# Patient Record
Sex: Male | Born: 1975
Health system: Southern US, Community
[De-identification: ages and names within clinical notes are randomized; demographics above are authoritative.]

## PROBLEM LIST (undated history)

## (undated) DIAGNOSIS — E119 Type 2 diabetes mellitus without complications: Secondary | ICD-10-CM

## (undated) DIAGNOSIS — K219 Gastro-esophageal reflux disease without esophagitis: Secondary | ICD-10-CM

## (undated) HISTORY — PX: WISDOM TOOTH EXTRACTION: SHX21

## (undated) HISTORY — PX: HERNIA REPAIR: SHX51

---

## 2006-11-28 ENCOUNTER — Emergency Department (HOSPITAL_COMMUNITY): Admission: EM | Admit: 2006-11-28 | Discharge: 2006-11-28 | Payer: Self-pay | Admitting: Emergency Medicine

## 2006-11-29 ENCOUNTER — Ambulatory Visit (HOSPITAL_BASED_OUTPATIENT_CLINIC_OR_DEPARTMENT_OTHER): Admission: RE | Admit: 2006-11-29 | Discharge: 2006-11-29 | Payer: Self-pay | Admitting: General Surgery

## 2006-11-29 ENCOUNTER — Encounter (INDEPENDENT_AMBULATORY_CARE_PROVIDER_SITE_OTHER): Payer: Self-pay | Admitting: General Surgery

## 2010-01-18 ENCOUNTER — Emergency Department (HOSPITAL_COMMUNITY): Admission: EM | Admit: 2010-01-18 | Discharge: 2010-01-18 | Payer: Self-pay | Admitting: Family Medicine

## 2010-11-16 NOTE — Op Note (Signed)
[name], [name]               ACCOUNT NO.:  [id number]   MEDICAL RECORD NO.:  [id number]          PATIENT TYPE:  AMB   LOCATION:  DSC                          FACILITY:  [facility]   PHYSICIAN:  Anselm Pancoast. Weatherly, M.D.DATE OF BIRTH:  01/22/1976   DATE OF PROCEDURE:  11/29/2006  DATE OF DISCHARGE:                               OPERATIVE REPORT   PREOPERATIVE DIAGNOSIS:  Umbilical hernia.   POSTOPERATIVE DIAGNOSIS:  Umbilical hernia.   PROCEDURE PERFORMED:  Umbilical herniorrhaphy.   ANESTHESIA:  General anesthesia.   SURGEON:  Anselm Pancoast. Zachery Dakins, M.D.   HISTORY:  Timothy Sutton is a 35 year old, about 245-pound male, who I  was called to see by Dr. Loleta Chance yesterday after he presented with a day-  and-a-half history of pain at the umbilicus with a small umbilical  hernia that he has had for years.  I saw the patient in the emergency  room at approximately 8:00 p.m. and reduced the hernia.  His pain  subsided immediately afterwards, and I added him to the OR schedule for  this afternoon.  The patient otherwise is in good health.  He weighs 239  pounds and I discussed about the possibility of placing mesh, but that I  would make that decision at the time of surgery.   DESCRIPTION OF PROCEDURE:  The abdomen was clipped after the induction  of general anesthesia with an endotracheal tube.  The abdomen was  prepped with Betadine and surgical scrubbing solution and then draped in  the sterile manner.  He had a little bulge above the umbilicus and I  made a transverse incision, sharp dissection, identifying the hernia sac  that was separated from the overlying skin and then circumferentially we  freed up the hernia sac.  The fascial defect was about the size of maybe  a quarter to maybe a 50-cent piece, a fairly large wad of hernia sac and  preperitoneal fatty tissue, and this was dissected sharply  circumferentially and then I closed the peritoneum to the fascia with a  running 2-0  Vicryl counter circumferentially.   The patient's fascia -- I elected not to put mesh in.  I felt that  transversing closure with #1 Novofil and five sutures were placed.  The  two lateral were placed, and then the three in betweening so that the  fascia was approximated, and then I used some 0 Surgilon to counter  incorporate the fascia between the counter Novofil sutures.  I  anesthetized the fascia with about 20 mL of 0.5% Marcaine with  adrenaline and clipped all the stitches.  Next the subcutaneous tissue  was closed in two layers with 3-0 Vicryl; and then the skin was closed  with interrupted simple sutures of 4-0  nylon.  A McCall's rolled up was placed in the umbilicus to create a  little fascial indentation or a little skin indentation, and then a 4 x  4 was placed over it and held in place with Hypafix.  The patient  tolerated the procedure nicely, awakened nicely, and will be released  after a short stay in the recovery  room.           ______________________________  Anselm Pancoast. Zachery Dakins, M.D.     WJW/MEDQ  D:  11/29/2006  T:  11/29/2006  Job:  [id number]   cc:   Annia Friendly. Loleta Chance, MD  Renaye Rakers, M.D.

## 2012-03-13 ENCOUNTER — Emergency Department (HOSPITAL_COMMUNITY)
Admission: EM | Admit: 2012-03-13 | Discharge: 2012-03-13 | Disposition: A | Discharge status: Home / Self Care | Payer: 59 | Attending: Emergency Medicine | Admitting: Emergency Medicine

## 2012-03-13 ENCOUNTER — Emergency Department (HOSPITAL_COMMUNITY): Payer: 59

## 2012-03-13 ENCOUNTER — Encounter (HOSPITAL_COMMUNITY): Payer: Self-pay | Admitting: Emergency Medicine

## 2012-03-13 DIAGNOSIS — Z806 Family history of leukemia: Secondary | ICD-10-CM | POA: Insufficient documentation

## 2012-03-13 DIAGNOSIS — R109 Unspecified abdominal pain: Secondary | ICD-10-CM | POA: Insufficient documentation

## 2012-03-13 DIAGNOSIS — Z833 Family history of diabetes mellitus: Secondary | ICD-10-CM | POA: Insufficient documentation

## 2012-03-13 DIAGNOSIS — R52 Pain, unspecified: Secondary | ICD-10-CM | POA: Insufficient documentation

## 2012-03-13 DIAGNOSIS — Z87891 Personal history of nicotine dependence: Secondary | ICD-10-CM | POA: Insufficient documentation

## 2012-03-13 LAB — COMPREHENSIVE METABOLIC PANEL
ALT: 30 U/L (ref 0–53)
AST: 28 U/L (ref 0–37)
CO2: 27 mEq/L (ref 19–32)
Calcium: 9.9 mg/dL (ref 8.4–10.5)
Sodium: 135 mEq/L (ref 135–145)
Total Protein: 8.2 g/dL (ref 6.0–8.3)

## 2012-03-13 LAB — CBC
MCH: 28.1 pg (ref 26.0–34.0)
Platelets: 361 10*3/uL (ref 150–400)
RBC: 5.41 MIL/uL (ref 4.22–5.81)

## 2012-03-13 LAB — URINALYSIS, ROUTINE W REFLEX MICROSCOPIC
Bilirubin Urine: NEGATIVE
Glucose, UA: NEGATIVE mg/dL
Hgb urine dipstick: NEGATIVE
Specific Gravity, Urine: 1.025 (ref 1.005–1.030)
pH: 7 (ref 5.0–8.0)

## 2012-03-13 MED ORDER — HYDROCODONE-ACETAMINOPHEN 5-500 MG PO TABS: 1.0000 | ORAL_TABLET | Freq: Four times a day (QID) | ORAL | Status: AC | PRN

## 2012-03-13 MED ORDER — IBUPROFEN 600 MG PO TABS: 600.0000 mg | ORAL_TABLET | Freq: Four times a day (QID) | ORAL | Status: AC | PRN

## 2012-03-13 MED ORDER — CYCLOBENZAPRINE HCL 10 MG PO TABS: 10.0000 mg | ORAL_TABLET | Freq: Two times a day (BID) | ORAL | Status: AC | PRN

## 2012-03-13 NOTE — ED Notes (Signed)
Pt states he started having pain in his right side last Friday but it was relieved by ibuprofen   Pt states he took more ibuprofen this am and felt ok but after a nap this evening when he woke up the pain was so bad he could hardly move  Pt states when he takes a deep breath it feels like something inside is pushing down

## 2012-03-13 NOTE — ED Notes (Signed)
Pt states he has been having right flank pain. Denies having difficulties urinating or noticing blood in urine.  Pt denies being in any pain at this time. Pt aware that he is being discharged and waiting on d/c paperwork. Gave pt ginger ale.

## 2012-03-13 NOTE — ED Notes (Signed)
Pt reports intermittent rt flank pain that began on Friday - pain relieved w/ ibuprofen, tonight pt states pain awoke him from sleep approx 0300 - pt admits to pain increasing w/ movement or lying flat - denies hematuria, n/v, or fever.

## 2012-03-13 NOTE — ED Provider Notes (Signed)
Medical screening examination/treatment/procedure(s) were performed by non-physician practitioner and as supervising physician I was immediately available for consultation/collaboration.  Amarion Portell, MD 03/13/12 1541 

## 2012-03-13 NOTE — ED Provider Notes (Signed)
History     CSN: [id number]  Arrival date & time 03/13/12  7829   First MD Initiated Contact with Patient 03/13/12 0606      Chief Complaint  Patient presents with  . Flank Pain    (Consider location/radiation/quality/duration/timing/severity/associated sxs/prior treatment) Patient is a 36 y.o. male presenting with flank pain. The history is provided by the patient.  Flank Pain This is a new problem. The current episode started in the past 7 days. The problem occurs constantly. The problem has been unchanged. Pertinent negatives include no abdominal pain, chest pain, chills, congestion, coughing, fever, headaches, nausea, neck pain, urinary symptoms, vomiting or weakness. The symptoms are aggravated by twisting and coughing (deep breathing). He has tried NSAIDs for the symptoms. The treatment provided mild relief.  Pt states pain is in the right flank. Started 3 days ago. Worsened by movement and when he takes a deep breath. At first improved with ibuprofen, now no relief. Tried stool softner as well with no improvement. No cough, no shortness of breath, no abdominal pain otherwise, no problem with urination or bowel movements.   History reviewed. No pertinent past medical history.  Past Surgical History  Procedure Date  . Hernia repair     Family History  Problem Relation Age of Onset  . Leukemia Father   . Diabetes Other     History  Substance Use Topics  . Smoking status: Former Smoker    Types: Cigarettes  . Smokeless tobacco: Not on file  . Alcohol Use: Yes     occ      Review of Systems  Constitutional: Negative for fever and chills.  HENT: Negative for congestion, neck pain and neck stiffness.   Respiratory: Negative for cough and chest tightness.   Cardiovascular: Negative for chest pain.  Gastrointestinal: Negative for nausea, vomiting and abdominal pain.  Genitourinary: Positive for flank pain. Negative for dysuria, hematuria and difficulty urinating.    Musculoskeletal: Positive for back pain.  Skin: Negative.   Neurological: Negative for dizziness, weakness and headaches.  Hematological: Negative.     Allergies  Review of patient's allergies indicates no known allergies.  Home Medications   Current Outpatient Rx  Name Route Sig Dispense Refill  . IBUPROFEN 200 MG PO TABS Oral Take 600 mg by mouth every 6 (six) hours as needed.      BP 136/81  Pulse 62  Temp 98.5 F (36.9 C) (Oral)  Resp 18  SpO2 97%  Physical Exam  Nursing note and vitals reviewed. Constitutional: He is oriented to person, place, and time. He appears well-developed and well-nourished. No distress.  Eyes: Conjunctivae are normal.  Neck: Neck supple.  Cardiovascular: Normal rate, regular rhythm and normal heart sounds.   Pulmonary/Chest: Effort normal and breath sounds normal. No respiratory distress. He has no wheezes. He has no rales.  Abdominal: Soft. Bowel sounds are normal. He exhibits no distension. There is no tenderness. There is no rebound.       No CVA tenderness bilaterally  Musculoskeletal: Normal range of motion. He exhibits no edema.  Neurological: He is alert and oriented to person, place, and time.  Skin: Skin is warm and dry.  Psychiatric: He has a normal mood and affect.    ED Course  Procedures (including critical care time)  Pt seen and examined. Right flank pain, no urinary symptoms, no abdominal pain, no n/v/d. Pain worsened with movement. Will get CXR to r/o right lower lung pathology, labs, liver function, UA.  Results for orders placed during the hospital encounter of 03/13/12  CBC      Component Value Range   WBC 7.4  4.0 - 10.5 K/uL   RBC 5.41  4.22 - 5.81 MIL/uL   Hemoglobin 15.2  13.0 - 17.0 g/dL   HCT 16.1  09.6 - 04.5 %   MCV 81.0  78.0 - 100.0 fL   MCH 28.1  26.0 - 34.0 pg   MCHC 34.7  30.0 - 36.0 g/dL   RDW 40.9  81.1 - 91.4 %   Platelets 361  150 - 400 K/uL  COMPREHENSIVE METABOLIC PANEL      Component Value  Range   Sodium 135  135 - 145 mEq/L   Potassium 4.4  3.5 - 5.1 mEq/L   Chloride 99  96 - 112 mEq/L   CO2 27  19 - 32 mEq/L   Glucose, Bld 110 (*) 70 - 99 mg/dL   BUN 11  6 - 23 mg/dL   Creatinine, Ser 7.82  0.50 - 1.35 mg/dL   Calcium 9.9  8.4 - 95.6 mg/dL   Total Protein 8.2  6.0 - 8.3 g/dL   Albumin 4.4  3.5 - 5.2 g/dL   AST 28  0 - 37 U/L   ALT 30  0 - 53 U/L   Alkaline Phosphatase 77  39 - 117 U/L   Total Bilirubin 0.4  0.3 - 1.2 mg/dL   GFR calc non Af Amer >90  >90 mL/min   GFR calc Af Amer >90  >90 mL/min  URINALYSIS, ROUTINE W REFLEX MICROSCOPIC      Component Value Range   Color, Urine YELLOW  YELLOW   APPearance CLOUDY (*) CLEAR   Specific Gravity, Urine 1.025  1.005 - 1.030   pH 7.0  5.0 - 8.0   Glucose, UA NEGATIVE  NEGATIVE mg/dL   Hgb urine dipstick NEGATIVE  NEGATIVE   Bilirubin Urine NEGATIVE  NEGATIVE   Ketones, ur NEGATIVE  NEGATIVE mg/dL   Protein, ur NEGATIVE  NEGATIVE mg/dL   Urobilinogen, UA 1.0  0.0 - 1.0 mg/dL   Nitrite NEGATIVE  NEGATIVE   Leukocytes, UA NEGATIVE  NEGATIVE   Dg Chest 2 View  03/13/2012  *RADIOLOGY REPORT*  Clinical Data: New onset right chest and right flank pain.  CHEST - 2 VIEW  Comparison: None.  Findings: The heart size and pulmonary vascularity are normal. The lungs appear clear and expanded without focal air space disease or consolidation. No blunting of the costophrenic angles.  No pneumothorax.  Mediastinal contours appear intact.  IMPRESSION: No evidence of active pulmonary disease.   Original Report Authenticated By: Marlon Pel, M.D.     7:39 AM Pt reassessed. Pain in right flank, at this time only with movement. He is PERC negative, negative CXR, labs unremarkable with normal LFTs, no WBC. At this time, i do not suspect acute abdomen, no abdominal tenderness on exam. UA unremarkable, no signs of infection or blood, doubt kidney stone or pyelonephritis. Pt has no problems with bowels. VS normal. Suspect pain is  musculoskeletal, however he will need close follow up for recheck and further testing if it persists. Pain meds, muscle relaxant, follow up with PCP. Return if worsening.   1. Acute right flank pain       MDM          Lottie Mussel, PA 03/13/12 1526

## 2014-08-20 ENCOUNTER — Emergency Department (HOSPITAL_COMMUNITY)
Admission: EM | Admit: 2014-08-20 | Discharge: 2014-08-20 | Disposition: A | Discharge status: Home / Self Care | Payer: 59 | Source: Home / Self Care | Attending: Family Medicine | Admitting: Family Medicine

## 2014-08-20 ENCOUNTER — Encounter (HOSPITAL_COMMUNITY): Payer: Self-pay | Admitting: Emergency Medicine

## 2014-08-20 ENCOUNTER — Emergency Department (INDEPENDENT_AMBULATORY_CARE_PROVIDER_SITE_OTHER): Payer: 59

## 2014-08-20 DIAGNOSIS — M79659 Pain in unspecified thigh: Secondary | ICD-10-CM

## 2014-08-20 MED ORDER — DICLOFENAC SODIUM 75 MG PO TBEC: 75.0000 mg | DELAYED_RELEASE_TABLET | Freq: Two times a day (BID) | ORAL | Status: DC | PRN

## 2014-08-20 NOTE — ED Provider Notes (Signed)
TYWAUN HILTNER is a 39 y.o. male who presents to Urgent Care today for left thigh pain present for 3 weeks. Patient notes 4-10 pain in the left posterior upper thigh. The pain does not radiate. No weakness or numbness bowel bladder dysfunction or difficulty walking. No injury. Patient notes that he shoveled snow before the pain started about 3 weeks ago. He has tried some ibuprofen which helps a little. The pain is aching and seems to be worse with activity and prolonged sitting.   History reviewed. No pertinent past medical history. Past Surgical History  Procedure Laterality Date  . Hernia repair     History  Substance Use Topics  . Smoking status: Former Smoker    Types: Cigarettes  . Smokeless tobacco: Not on file  . Alcohol Use: Yes     Comment: occ   ROS as above Medications: No current facility-administered medications for this encounter.   Current Outpatient Prescriptions  Medication Sig Dispense Refill  . diclofenac (VOLTAREN) 75 MG EC tablet Take 1 tablet (75 mg total) by mouth 2 (two) times daily as needed. 30 tablet 0   No Known Allergies   Exam:  BP 132/80 mmHg  Pulse 65  Temp(Src) 98.7 F (37.1 C) (Oral)  Resp 20  SpO2 100% Gen: Well NAD HEENT: EOMI,  MMM Lungs: Normal work of breathing. CTABL Heart: RRR no MRG Abd: NABS, Soft. Nondistended, Nontender Exts: Brisk capillary refill, warm and well perfused.  MSK: Act nontender to spinal midline. Negative straight leg raise test and Faber test bilaterally Pelvis hips nontender bilaterally with normal range of motion. Initial tuberosity is nontender on left. Thigh posterior thigh is mildly tender with some pain with resisted knee flexion No palpable defect present left thigh Normal H test bilaterally   No results found for this or any previous visit (from the past 24 hour(s)). Dg Femur Min 2 Views Left  08/20/2014   CLINICAL DATA:  Left thigh pain for 3 weeks, no known injury  EXAM: LEFT FEMUR 2 VIEWS   COMPARISON:  None.  FINDINGS: Four views of left femur submitted. No acute fracture or subluxation. Visualized left hip joint is unremarkable. No periosteal reaction or bony erosion.  IMPRESSION: Negative.   Electronically Signed   By: Lahoma Crocker M.D.   On: 08/20/2014 10:12    Assessment and Plan: 39 y.o. male with posterior thigh pain. I suspect patient has a hamstring strain. Plan to perform Astelin hamstring exercises and use diclofenac for pain. Follow up with sports medicine if not better.  Discussed warning signs or symptoms. Please see discharge instructions. Patient expresses understanding.     Gregor Hams, MD 08/20/14 1028

## 2014-08-20 NOTE — Discharge Instructions (Signed)
Thank you for coming in today.  I think you have a hamstring injury.  Do the Astelin Hamstring Exercises.   Return as needed.  Follow up with Dr. Tamala Julian as needed.  Hamstring Strain with Rehab The hamstring muscle and tendons are vulnerable to muscle or tendon tear (strain). Hamstring tears cause pain and inflammation in the backside of the thigh, where the hamstring muscles are located. The hamstring is comprised of three muscles that are responsible for straightening the hip, bending the knee, and stabilizing the knee. These muscles are important for walking, running, and jumping. Hamstring strain is the most common injury of the thigh. Hamstring strains are classified as grade 1 or 2 strains. Grade 1 strains cause pain, but the tendon is not lengthened. Grade 2 strains include a lengthened ligament due to the ligament being stretched or partially ruptured. With grade 2 strains there is still function, although the function may be decreased.  SYMPTOMS   Pain, tenderness, swelling, warmth, or redness over the hamstring muscles, at the back of the thigh.  Pain that gets worse during and after intense activity.  A "pop" heard in the area, at the time of injury.  Muscle spasm in the hamstring muscles.  Pain or weakness with running, jumping, or bending the knee against resistance.  Crackling sound (crepitation) when the tendon is moved or touched.  Bruising (contusion) in the thigh within 48 hours of injury.  Loss of fullness of the muscle, or area of muscle bulging in the case of a complete rupture. CAUSES  A muscle strain occurs when a force is placed on the muscle or tendon that is greater than it can withstand. Common causes of injury include:  Strain from overuse or sudden increase in the frequency, duration, or intensity of activity.  Single violent blow or force to the back of the knee or the hamstring area of the thigh. RISK INCREASES WITH:  Sports that require quick starts  (sprinting, racquetball, tennis).  Sports that require jumping (basketball, volleyball).  Kicking sports and water skiing.  Contact sports (soccer, football).  Poor strength and flexibility.  Failure to warm up properly before activity.  Previous thigh, knee, or pelvis injury.  Poor exercise technique.  Poor posture. PREVENTION  Maintain physical fitness:  Strength, flexibility, and endurance.  Cardiovascular fitness.  Learn and use proper exercise technique and posture.  Wear proper fitted and padded protective equipment. PROGNOSIS  If treated properly, hamstring strains are usually curable in 2 to 6 weeks. RELATED COMPLICATIONS   Longer healing time, if not properly treated or if not given adequate time to heal.  Chronically inflamed tendon, causing persistent pain with activity that may progress to constant pain.  Recurring symptoms, if activity is resumed too soon.  Vulnerable to repeated injury (in up to 25% of cases). TREATMENT  Treatment first involves the use of ice and medication to help reduce pain and inflammation. It is also important to complete strengthening and stretching exercises, as well as modifying any activities that aggravate the symptoms. These exercises may be completed at home or with a therapist. Your caregiver may recommend the use of crutches to help reduce pain and discomfort, especially is the strain is severe enough to cause limping. If the tendon has pulled away from the bone, then surgery is necessary to reattach it. MEDICATION   If pain medicine is needed, nonsteroidal anti-inflammatory medicines (aspirin and ibuprofen), or other minor pain relievers (acetaminophen), are often advised.  Do not take pain medicine for  7 days before surgery.  Prescription pain relievers may be given if your caregiver thinks they are needed. Use only as directed and only as much as you need.  Corticosteroid injections may be recommended. However, these  injections should only be used for serious cases, as they can only be given a certain number of times.  Ointments applied to the skin may be beneficial. HEAT AND COLD  Cold treatment (icing) relieves pain and reduces inflammation. Cold treatment should be applied for 10 to 15 minutes every 2 to 3 hours, and immediately after activity that aggravates your symptoms. Use ice packs or an ice massage.  Heat treatment may be used before performing the stretching and strengthening activities prescribed by your caregiver, physical therapist, or athletic trainer. Use a heat pack or a warm water soak. SEEK MEDICAL CARE IF:   Symptoms get worse or do not improve in 2 weeks, despite treatment.  New, unexplained symptoms develop. (Drugs used in treatment may produce side effects.) EXERCISES RANGE OF MOTION (ROM) AND STRETCHING EXERCISES - Hamstring Strain These exercises may help you when beginning to rehabilitate your injury. Your symptoms may go away with or without further involvement from your physician, physical therapist or athletic trainer. While completing these exercises, remember:   Restoring tissue flexibility helps normal motion to return to the joints. This allows healthier, less painful movement and activity.  An effective stretch should be held for at least 30 seconds.  A stretch should never be painful. You should only feel a gentle lengthening or release in the stretched tissue. STRETCH - Hamstrings, Standing  Stand or sit, and extend your right / left leg, placing your foot on a chair or foot stool.  Keep a slight arch in your low back and your hips straight forward.  Lead with your chest, and lean forward at the waist until you feel a gentle stretch in the back of your right / left knee or thigh. (When done correctly, this exercise requires leaning only a small distance.)  Hold this position for __________ seconds. Repeat __________ times. Complete this stretch __________ times  per day. STRETCH - Hamstrings, Supine   Lie on your back. Loop a belt or towel over the ball of your right / left foot.  Straighten your right / left knee and slowly pull on the belt to raise your leg. Do not allow the right / left knee to bend. Keep your opposite leg flat on the floor.  Raise the leg until you feel a gentle stretch behind your right / left knee or thigh. Hold this position for __________ seconds. Repeat __________ times. Complete this stretch __________ times per day.  STRETCH - Hamstrings, Doorway  Lie on your back with your right / left leg extended and resting on the wall, and the opposite leg flat on the ground through the door. Initially, position your bottom farther away from the wall.  Keep your right / left knee straight. If you feel a stretch behind your knee or thigh, hold this position for __________ seconds.  If you do not feel a stretch, scoot your bottom closer to the door and hold __________ seconds. Repeat __________ times. Complete this stretch __________ times per day.  STRETCH - Hamstrings/Adductors, V-Sit   Sit on the floor with your legs extended in a large "V," keeping your knees straight.  With your head and chest upright, bend at your waist reaching for your left foot to stretch your right thigh muscles.  You should feel  a stretch in your right inner thigh. Hold for __________ seconds.  Return to the upright position to relax your leg muscles.  Continuing to keep your chest upright, bend straight forward at your waist to stretch your hamstrings.  You should feel a stretch behind both of your thighs and knees. Hold for __________ seconds.  Return to the upright position to relax your leg muscles.  With your head and chest upright, bend at your waist reaching for your right foot to stretch your left thigh muscles.  You should feel a stretch in your left inner thigh. Hold for __________ seconds.  Return to the upright position to relax your  leg muscles. Repeat __________ times. Complete this exercise __________ times per day.  STRENGTHENING EXERCISES - Hamstring Strain These exercises may help you when beginning to rehabilitate your injury. They may resolve your symptoms with or without further involvement from your physician, physical therapist or athletic trainer. While completing these exercises, remember:   Muscles can gain both the endurance and the strength needed for everyday activities through controlled exercises.  Complete these exercises as instructed by your physician, physical therapist or athletic trainer. Increase the resistance and repetitions only as guided.  You may experience muscle soreness or fatigue, but the pain or discomfort you are trying to eliminate should never get worse during these exercises. If this pain does get worse, stop and make certain you are following the directions exactly. If the pain is still present after adjustments, discontinue the exercise until you can discuss the trouble with your clinician. STRENGTH - Hip Extensors, Straight Leg Raises   Lie on your stomach on a firm surface.  Tense the muscles in your buttocks to lift your right / left leg about 4 inches. If you cannot lift your leg this high without arching your back, place a pillow under your hips.  Keep your knee straight. Hold for __________ seconds.  Slowly lower your leg to the starting position and allow it to relax completely before starting the next repetition. Repeat __________ times. Complete this exercise __________ times per day.  STRENGTH - Hamstring, Isometrics   Lie on your back on a firm surface.  Bend your right / left knee approximately __________ degrees.  Dig your heel into the surface, as if you are trying to pull it toward your buttocks. Tighten the muscles in the back of your thighs to "dig" as hard as you can, without increasing any pain.  Hold this position for __________ seconds.  Release the  tension gradually and allow your muscles to completely relax for __________ seconds between each exercise. Repeat __________ times. Complete this exercise __________ times per day.  STRENGTH - Hamstring, Curls   Lay on your stomach with your legs extended. (If you lay on a bed, your feet may hang over the edge.)  Tighten the muscles in the back of your thigh to bend your right / left knee up to 90 degrees. Keep your hips flat on the bed or floor.  Hold this position for __________ seconds.  Slowly lower your leg back to the starting position. Repeat __________ times. Complete this exercise __________ times per day.  OPTIONAL ANKLE WEIGHTS: Begin with ____________________, but DO NOT exceed ____________________. Increase in 1 pound/0.5 kilogram increments. Document Released: 06/20/2005 Document Revised: 09/12/2011 Document Reviewed: 10/02/2008 [facility] Patient Information 2015 [place], [place]. This information is not intended to replace advice given to you by your health care provider. Make sure you discuss any questions you have with  your health care provider.

## 2014-08-20 NOTE — ED Notes (Signed)
C/o left leg pain onset 3 weeks; pain is 4/10 Pain radiates down the leg w/intermittent numbness Denies inj/trauma Steady gait Alert, no signs of acute distress. `

## 2015-12-02 ENCOUNTER — Encounter (HOSPITAL_COMMUNITY): Payer: Self-pay | Admitting: Emergency Medicine

## 2015-12-02 ENCOUNTER — Encounter (HOSPITAL_COMMUNITY): Payer: Self-pay | Admitting: *Deleted

## 2015-12-02 ENCOUNTER — Ambulatory Visit (HOSPITAL_COMMUNITY)
Admission: EM | Admit: 2015-12-02 | Discharge: 2015-12-02 | Disposition: A | Discharge status: Nursing Home (Includes ICF) | Payer: Commercial Managed Care - HMO | Source: Home / Self Care | Attending: Emergency Medicine | Admitting: Emergency Medicine

## 2015-12-02 ENCOUNTER — Observation Stay (HOSPITAL_COMMUNITY)
Admission: EM | Admit: 2015-12-02 | Discharge: 2015-12-05 | Disposition: A | Discharge status: Home / Self Care | Payer: Commercial Managed Care - HMO | Attending: Internal Medicine | Admitting: Internal Medicine

## 2015-12-02 DIAGNOSIS — R739 Hyperglycemia, unspecified: Secondary | ICD-10-CM

## 2015-12-02 DIAGNOSIS — E871 Hypo-osmolality and hyponatremia: Secondary | ICD-10-CM | POA: Diagnosis not present

## 2015-12-02 DIAGNOSIS — E1110 Type 2 diabetes mellitus with ketoacidosis without coma: Secondary | ICD-10-CM | POA: Insufficient documentation

## 2015-12-02 DIAGNOSIS — Z791 Long term (current) use of non-steroidal anti-inflammatories (NSAID): Secondary | ICD-10-CM | POA: Insufficient documentation

## 2015-12-02 DIAGNOSIS — E1310 Other specified diabetes mellitus with ketoacidosis without coma: Secondary | ICD-10-CM | POA: Diagnosis not present

## 2015-12-02 DIAGNOSIS — F17210 Nicotine dependence, cigarettes, uncomplicated: Secondary | ICD-10-CM | POA: Insufficient documentation

## 2015-12-02 DIAGNOSIS — E781 Pure hyperglyceridemia: Secondary | ICD-10-CM | POA: Diagnosis not present

## 2015-12-02 DIAGNOSIS — R824 Acetonuria: Secondary | ICD-10-CM

## 2015-12-02 DIAGNOSIS — E860 Dehydration: Secondary | ICD-10-CM

## 2015-12-02 DIAGNOSIS — Z833 Family history of diabetes mellitus: Secondary | ICD-10-CM | POA: Insufficient documentation

## 2015-12-02 DIAGNOSIS — E876 Hypokalemia: Secondary | ICD-10-CM | POA: Diagnosis not present

## 2015-12-02 DIAGNOSIS — Z6841 Body Mass Index (BMI) 40.0 and over, adult: Secondary | ICD-10-CM | POA: Insufficient documentation

## 2015-12-02 DIAGNOSIS — E1165 Type 2 diabetes mellitus with hyperglycemia: Secondary | ICD-10-CM | POA: Diagnosis not present

## 2015-12-02 DIAGNOSIS — N179 Acute kidney failure, unspecified: Secondary | ICD-10-CM | POA: Diagnosis not present

## 2015-12-02 DIAGNOSIS — E669 Obesity, unspecified: Secondary | ICD-10-CM | POA: Diagnosis not present

## 2015-12-02 DIAGNOSIS — R358 Other polyuria: Secondary | ICD-10-CM | POA: Diagnosis present

## 2015-12-02 DIAGNOSIS — E785 Hyperlipidemia, unspecified: Secondary | ICD-10-CM | POA: Insufficient documentation

## 2015-12-02 HISTORY — DX: Type 2 diabetes mellitus without complications: E11.9

## 2015-12-02 LAB — URINALYSIS, ROUTINE W REFLEX MICROSCOPIC
Bilirubin Urine: NEGATIVE
Leukocytes, UA: NEGATIVE
Nitrite: NEGATIVE
PROTEIN: 30 mg/dL — AB
Specific Gravity, Urine: 1.01 (ref 1.005–1.030)
pH: 5.5 (ref 5.0–8.0)

## 2015-12-02 LAB — BASIC METABOLIC PANEL
Anion gap: 17 — ABNORMAL HIGH (ref 5–15)
BUN: 15 mg/dL (ref 6–20)
CHLORIDE: 100 mmol/L — AB (ref 101–111)
CO2: 12 mmol/L — AB (ref 22–32)
CREATININE: 1.46 mg/dL — AB (ref 0.61–1.24)
Calcium: 9.3 mg/dL (ref 8.9–10.3)
GFR calc Af Amer: >60 mL/min (ref >60)
GFR calc non Af Amer: 59 mL/min — ABNORMAL LOW (ref >60)
Glucose, Bld: 491 mg/dL — ABNORMAL HIGH (ref 65–99)
Potassium: 4.4 mmol/L (ref 3.5–5.1)
SODIUM: 129 mmol/L — AB (ref 135–145)

## 2015-12-02 LAB — POCT URINALYSIS DIP (DEVICE)
BILIRUBIN URINE: NEGATIVE
Glucose, UA: 500 mg/dL — AB
Ketones, ur: >=160 mg/dL — AB
Leukocytes, UA: NEGATIVE
NITRITE: NEGATIVE
PH: 5.5 (ref 5.0–8.0)
Protein, ur: 30 mg/dL — AB
Specific Gravity, Urine: 1.015 (ref 1.005–1.030)
Urobilinogen, UA: 0.2 mg/dL (ref 0.0–1.0)

## 2015-12-02 LAB — CBC
HCT: 46.5 % (ref 39.0–52.0)
Hemoglobin: 16.5 g/dL (ref 13.0–17.0)
MCH: 28.5 pg (ref 26.0–34.0)
MCHC: 35.5 g/dL (ref 30.0–36.0)
MCV: 80.3 fL (ref 78.0–100.0)
PLATELETS: 307 10*3/uL (ref 150–400)
RBC: 5.79 MIL/uL (ref 4.22–5.81)
RDW: 13.9 % (ref 11.5–15.5)
WBC: 7.9 10*3/uL (ref 4.0–10.5)

## 2015-12-02 LAB — POCT I-STAT, CHEM 8
BUN: 19 mg/dL (ref 6–20)
CALCIUM ION: 1.32 mmol/L — AB (ref 1.12–1.23)
CHLORIDE: 103 mmol/L (ref 101–111)
Creatinine, Ser: 1 mg/dL (ref 0.61–1.24)
GLUCOSE: 512 mg/dL — AB (ref 65–99)
HCT: 54 % — ABNORMAL HIGH (ref 39.0–52.0)
Hemoglobin: 18.4 g/dL — ABNORMAL HIGH (ref 13.0–17.0)
Potassium: 4.3 mmol/L (ref 3.5–5.1)
Sodium: 133 mmol/L — ABNORMAL LOW (ref 135–145)
TCO2: 13 mmol/L (ref 0–100)

## 2015-12-02 LAB — URINE MICROSCOPIC-ADD ON

## 2015-12-02 MED ORDER — INSULIN ASPART 100 UNIT/ML ~~LOC~~ SOLN
5.0000 [IU] | Freq: Once | SUBCUTANEOUS | Status: AC
  Administered 2015-12-03: 5 [IU] via INTRAVENOUS
  Filled 2015-12-02: qty 1

## 2015-12-02 MED ORDER — SODIUM CHLORIDE 0.9 % IV BOLUS (SEPSIS)
1000.0000 mL | Freq: Once | INTRAVENOUS | Status: AC
  Administered 2015-12-03: 1000 mL via INTRAVENOUS

## 2015-12-02 NOTE — ED Provider Notes (Signed)
CSN: [id number]     Arrival date & time 12/02/15  1901 History   First MD Initiated Contact with Patient 12/02/15 2015     Chief Complaint  Patient presents with  . Polyuria   (Consider location/radiation/quality/duration/timing/severity/associated sxs/prior Treatment) HPI Comments: 40 year old obese male presenting to the urgent care with a 2-3 week history of polyuria, polydipsia, headaches, fatigue and weakness. He also has pain across the lower back and he has not had a bowel movement in 3 days. Has taken no medications for his constipation.   History reviewed. No pertinent past medical history. Past Surgical History  Procedure Laterality Date  . Hernia repair     Family History  Problem Relation Age of Onset  . Leukemia Father   . Diabetes Father   . Diabetes Other    Social History  Substance Use Topics  . Smoking status: Former Smoker    Types: Cigarettes  . Smokeless tobacco: None  . Alcohol Use: Yes     Comment: occ    Review of Systems  Constitutional: Positive for activity change. Negative for fever and chills.  HENT: Negative.   Respiratory: Negative.  Negative for cough and shortness of breath.   Cardiovascular: Negative for chest pain and leg swelling.  Gastrointestinal: Positive for constipation.  Endocrine: Positive for polydipsia and polyuria.  Genitourinary: Negative for dysuria.  Musculoskeletal: Positive for back pain.  Skin: Negative.   Neurological: Negative.   All other systems reviewed and are negative.   Allergies  Review of patient's allergies indicates no known allergies.  Home Medications   Prior to Admission medications   Medication Sig Start Date End Date Taking? Authorizing Provider  ibuprofen (ADVIL,MOTRIN) 400 MG tablet Take 400 mg by mouth 2 (two) times daily.   Yes Historical Provider, MD  diclofenac (VOLTAREN) 75 MG EC tablet Take 1 tablet (75 mg total) by mouth 2 (two) times daily as needed. 08/20/14   Gregor Hams, MD   Meds  Ordered and Administered this Visit  Medications - No data to display  BP 139/79 mmHg  Pulse 83  Temp(Src) 98.3 F (36.8 C) (Oral)  Resp 18  SpO2 100% No data found.   Physical Exam  Constitutional: He is oriented to person, place, and time. He appears well-developed and well-nourished. No distress.  HENT:  Head: Normocephalic and atraumatic.  Eyes: EOM are normal.  Neck: Normal range of motion. Neck supple.  Cardiovascular: Normal rate, regular rhythm and normal heart sounds.   Pulmonary/Chest: Effort normal and breath sounds normal. No respiratory distress.  Musculoskeletal: He exhibits no edema.  Lymphadenopathy:    He has no cervical adenopathy.  Neurological: He is alert and oriented to person, place, and time. He exhibits normal muscle tone.  Skin: Skin is warm and dry.  Psychiatric: He has a normal mood and affect.  Nursing note and vitals reviewed.   ED Course  Procedures (including critical care time)  Labs Review Labs Reviewed  POCT URINALYSIS DIP (DEVICE) - Abnormal; Notable for the following:    Glucose, UA 500 (*)    Ketones, ur >=160 (*)    Hgb urine dipstick TRACE (*)    Protein, ur 30 (*)    All other components within normal limits  POCT I-STAT, CHEM 8 - Abnormal; Notable for the following:    Sodium 133 (*)    Glucose, Bld 512 (*)    Calcium, Ion 1.32 (*)    Hemoglobin 18.4 (*)    HCT 54.0 (*)  All other components within normal limits    Imaging Review No results found.   Visual Acuity Review  Right Eye Distance:   Left Eye Distance:   Bilateral Distance:    Right Eye Near:   Left Eye Near:    Bilateral Near:         MDM   1. Hyperglycemia   2. Type 2 diabetes mellitus with hyperglycemia, without long-term current use of insulin (HCC)   3. Dehydration   4. Hyponatremia   5. Ketonuria    Transfer to the emergency department via private vehicle for evaluation of hyperglycemia secondary to newly diagnosed type 2 diabetes  mellitus.    Janne Napoleon, NP 12/02/15 [id number]  Janne Napoleon, NP 12/02/15 2050

## 2015-12-02 NOTE — ED Provider Notes (Signed)
CSN: [id number]     Arrival date & time 12/02/15  2109 History   First MD Initiated Contact with Patient 12/02/15 2320     Chief Complaint  Patient presents with  . Hyperglycemia     (Consider location/radiation/quality/duration/timing/severity/associated sxs/prior Treatment) HPI Timothy Sutton is a 40 y.o. male with no significant PMH significant who presents from [facility] via POV for further evaluation of new onset DMT2.  At [facility] patient found to be hyperglycemic (500) with kentonuria and glycosuria.Patient reports over the last 2-3 weeks gradual onset, intermittent, worsening feelings of generally weak, fatigue, polyuria, polydipsia, and generalized headaches. He states his las BM was 3 days ago. Denies fever, chills, N/V/D, abdominal pain, dysuria, hematuria, cough, SOB, or CP.  No medication changes.   PCP: [facility]  Past Medical History  Diagnosis Date  . Diabetes mellitus without complication [facility])    Past Surgical History  Procedure Laterality Date  . Hernia repair     Family History  Problem Relation Age of Onset  . Leukemia Father   . Diabetes Father   . Diabetes Other    Social History  Substance Use Topics  . Smoking status: Current Every Day Smoker    Types: Cigarettes  . Smokeless tobacco: None  . Alcohol Use: Yes     Comment: occ    Review of Systems All other systems negative unless otherwise stated in HPI    Allergies  Tomato  Home Medications   Prior to Admission medications   Medication Sig Start Date End Date Taking? Authorizing Provider  ibuprofen (ADVIL,MOTRIN) 400 MG tablet Take 400 mg by mouth 2 (two) times daily.   Yes Historical Provider, MD  Multiple Vitamin (MULTIVITAMIN) LIQD Take 5 mLs by mouth daily.   Yes Historical Provider, MD   BP 135/82 mmHg  Pulse 72  Temp(Src) 98 F (36.7 C) (Oral)  Resp 20  Wt 121.882 kg  SpO2 100% Physical Exam  Constitutional: He is oriented to person, place, and time. He appears well-developed and  well-nourished.  Non-toxic appearance. He does not have a sickly appearance. He does not appear ill.  Obese   HENT:  Head: Normocephalic and atraumatic.  Mouth/Throat: Oropharynx is clear and moist.  Eyes: Conjunctivae are normal.  Neck: Normal range of motion. Neck supple.  Cardiovascular: Normal rate and regular rhythm.   Pulmonary/Chest: Effort normal and breath sounds normal. No accessory muscle usage or stridor. No respiratory distress. He has no wheezes. He has no rhonchi. He has no rales.  Abdominal: Soft. Bowel sounds are normal. He exhibits no distension. There is no tenderness. There is no rebound and no guarding.  Musculoskeletal: Normal range of motion.  Lymphadenopathy:    He has no cervical adenopathy.  Neurological: He is alert and oriented to person, place, and time.  Speech clear without dysarthria.  Skin: Skin is warm and dry.  Psychiatric: He has a normal mood and affect. His behavior is normal.    ED Course  Procedures (including critical care time) Labs Review Labs Reviewed  BASIC METABOLIC PANEL - Abnormal; Notable for the following:    Sodium 129 (*)    Chloride 100 (*)    CO2 12 (*)    Glucose, Bld 491 (*)    Creatinine, Ser 1.46 (*)    GFR calc non Af Amer 59 (*)    Anion gap 17 (*)    All other components within normal limits  URINALYSIS, ROUTINE W REFLEX MICROSCOPIC (NOT AT [facility]) - Abnormal;  Notable for the following:    Glucose, UA >1000 (*)    Hgb urine dipstick TRACE (*)    Ketones, ur >80 (*)    Protein, ur 30 (*)    All other components within normal limits  URINE MICROSCOPIC-ADD ON - Abnormal; Notable for the following:    Squamous Epithelial / LPF 0-5 (*)    Bacteria, UA RARE (*)    Casts HYALINE CASTS (*)    All other components within normal limits  CBG MONITORING, ED - Abnormal; Notable for the following:    Glucose-Capillary 433 (*)    All other components within normal limits  CBG MONITORING, ED - Abnormal; Notable for the  following:    Glucose-Capillary 326 (*)    All other components within normal limits  CBC    Imaging Review No results found. I have personally reviewed and evaluated these images and lab results as part of my medical decision-making.   EKG Interpretation None      MDM   Final diagnoses:  Hyperglycemia  AKI (acute kidney injury) ([place])  Dehydration   Patient presents for further evaluation of new onset DM with evidence of metabolic acidosis. VSS, NAD. Patient appears well. No abdominal pain, N/V/D.  Labs drawn in triage show glycosuria, ketonuria, and proteinuria.  Glucose 491, Na 129 (likely false hyponatremia secondary to hyperglycemia, corrected sodium 135), K 4.4, bicarb 12, anion gap 17, Cr 1.46 (baseline 0.9).  Will give fluids and 5U insulin and reassess. CBG recheck 326, will give additional 2L and 5U.  Plan to admit to medicine for hyperglycemia, AKI, and dehydration for continued fluids and glucose management. Appreciate Dr. Hal Hope.   Case has been discussed with Dr. Venora Maples who agrees with the above plan for admission.       Gloriann Loan, PA-C 12/03/15 [place], MD 12/03/15 [phone]

## 2015-12-02 NOTE — ED Notes (Signed)
Pt reports  For sev   Weeks     He  Has  Had  Frequent  Urination     Weakness   And  Being  Very  Thirsty       he  Has  A  Family  History  Of  Diabetes  But  He  denys    Having  It

## 2015-12-02 NOTE — ED Notes (Signed)
Pt sent from [facility] for new onset diabetes.  Pt reports feeling tired for several weeks, increased thirst, and increased urination.

## 2015-12-03 ENCOUNTER — Encounter (HOSPITAL_COMMUNITY): Payer: Self-pay | Admitting: Internal Medicine

## 2015-12-03 ENCOUNTER — Observation Stay (HOSPITAL_COMMUNITY): Payer: Commercial Managed Care - HMO

## 2015-12-03 DIAGNOSIS — R739 Hyperglycemia, unspecified: Secondary | ICD-10-CM | POA: Insufficient documentation

## 2015-12-03 DIAGNOSIS — E1310 Other specified diabetes mellitus with ketoacidosis without coma: Secondary | ICD-10-CM | POA: Diagnosis not present

## 2015-12-03 DIAGNOSIS — N179 Acute kidney failure, unspecified: Secondary | ICD-10-CM | POA: Diagnosis not present

## 2015-12-03 DIAGNOSIS — E871 Hypo-osmolality and hyponatremia: Secondary | ICD-10-CM | POA: Diagnosis present

## 2015-12-03 DIAGNOSIS — E1110 Type 2 diabetes mellitus with ketoacidosis without coma: Secondary | ICD-10-CM | POA: Diagnosis present

## 2015-12-03 LAB — BASIC METABOLIC PANEL
ANION GAP: 12 (ref 5–15)
ANION GAP: 10 (ref 5–15)
ANION GAP: 7 (ref 5–15)
ANION GAP: 8 (ref 5–15)
BUN: 13 mg/dL (ref 6–20)
BUN: 11 mg/dL (ref 6–20)
BUN: 8 mg/dL (ref 6–20)
BUN: 8 mg/dL (ref 6–20)
CALCIUM: 8.5 mg/dL — AB (ref 8.9–10.3)
CALCIUM: 8.7 mg/dL — AB (ref 8.9–10.3)
CHLORIDE: 108 mmol/L (ref 101–111)
CHLORIDE: 111 mmol/L (ref 101–111)
CHLORIDE: 112 mmol/L — AB (ref 101–111)
CHLORIDE: 110 mmol/L (ref 101–111)
CO2: 14 mmol/L — AB (ref 22–32)
CO2: 14 mmol/L — AB (ref 22–32)
CO2: 16 mmol/L — AB (ref 22–32)
CO2: 17 mmol/L — AB (ref 22–32)
Calcium: 8.4 mg/dL — ABNORMAL LOW (ref 8.9–10.3)
Calcium: 8.4 mg/dL — ABNORMAL LOW (ref 8.9–10.3)
Creatinine, Ser: 1.37 mg/dL — ABNORMAL HIGH (ref 0.61–1.24)
Creatinine, Ser: 1.08 mg/dL (ref 0.61–1.24)
Creatinine, Ser: 0.9 mg/dL (ref 0.61–1.24)
Creatinine, Ser: 0.91 mg/dL (ref 0.61–1.24)
GFR calc non Af Amer: >60 mL/min (ref >60)
GFR calc non Af Amer: >60 mL/min (ref >60)
GFR calc non Af Amer: >60 mL/min (ref >60)
GFR calc non Af Amer: >60 mL/min (ref >60)
GLUCOSE: 177 mg/dL — AB (ref 65–99)
Glucose, Bld: 338 mg/dL — ABNORMAL HIGH (ref 65–99)
Glucose, Bld: 262 mg/dL — ABNORMAL HIGH (ref 65–99)
Glucose, Bld: 163 mg/dL — ABNORMAL HIGH (ref 65–99)
POTASSIUM: 3.2 mmol/L — AB (ref 3.5–5.1)
Potassium: 3.9 mmol/L (ref 3.5–5.1)
Potassium: 4.2 mmol/L (ref 3.5–5.1)
Potassium: 3.4 mmol/L — ABNORMAL LOW (ref 3.5–5.1)
Sodium: 134 mmol/L — ABNORMAL LOW (ref 135–145)
Sodium: 135 mmol/L (ref 135–145)
Sodium: 135 mmol/L (ref 135–145)
Sodium: 135 mmol/L (ref 135–145)

## 2015-12-03 LAB — CBC
HCT: 41.1 % (ref 39.0–52.0)
HEMOGLOBIN: 14 g/dL (ref 13.0–17.0)
MCH: 27.6 pg (ref 26.0–34.0)
MCHC: 34.1 g/dL (ref 30.0–36.0)
MCV: 81.1 fL (ref 78.0–100.0)
Platelets: 243 10*3/uL (ref 150–400)
RBC: 5.07 MIL/uL (ref 4.22–5.81)
RDW: 14.2 % (ref 11.5–15.5)
WBC: 6.2 10*3/uL (ref 4.0–10.5)

## 2015-12-03 LAB — TSH: TSH: 1.415 u[IU]/mL (ref 0.350–4.500)

## 2015-12-03 LAB — CBG MONITORING, ED
GLUCOSE-CAPILLARY: 326 mg/dL — AB (ref 65–99)
Glucose-Capillary: 315 mg/dL — ABNORMAL HIGH (ref 65–99)
Glucose-Capillary: 433 mg/dL — ABNORMAL HIGH (ref 65–99)

## 2015-12-03 LAB — GLUCOSE, CAPILLARY
GLUCOSE-CAPILLARY: 372 mg/dL — AB (ref 65–99)
GLUCOSE-CAPILLARY: 246 mg/dL — AB (ref 65–99)
GLUCOSE-CAPILLARY: 220 mg/dL — AB (ref 65–99)
GLUCOSE-CAPILLARY: 199 mg/dL — AB (ref 65–99)
GLUCOSE-CAPILLARY: 142 mg/dL — AB (ref 65–99)
GLUCOSE-CAPILLARY: 141 mg/dL — AB (ref 65–99)
GLUCOSE-CAPILLARY: 146 mg/dL — AB (ref 65–99)
GLUCOSE-CAPILLARY: 134 mg/dL — AB (ref 65–99)
GLUCOSE-CAPILLARY: 146 mg/dL — AB (ref 65–99)
Glucose-Capillary: 133 mg/dL — ABNORMAL HIGH (ref 65–99)
Glucose-Capillary: 137 mg/dL — ABNORMAL HIGH (ref 65–99)
Glucose-Capillary: 145 mg/dL — ABNORMAL HIGH (ref 65–99)
Glucose-Capillary: 165 mg/dL — ABNORMAL HIGH (ref 65–99)
Glucose-Capillary: 179 mg/dL — ABNORMAL HIGH (ref 65–99)
Glucose-Capillary: 182 mg/dL — ABNORMAL HIGH (ref 65–99)
Glucose-Capillary: 183 mg/dL — ABNORMAL HIGH (ref 65–99)
Glucose-Capillary: 216 mg/dL — ABNORMAL HIGH (ref 65–99)
Glucose-Capillary: 345 mg/dL — ABNORMAL HIGH (ref 65–99)

## 2015-12-03 LAB — RAPID URINE DRUG SCREEN, HOSP PERFORMED
AMPHETAMINES: NOT DETECTED
BENZODIAZEPINES: NOT DETECTED
Barbiturates: NOT DETECTED
Cocaine: NOT DETECTED
OPIATES: NOT DETECTED
Tetrahydrocannabinol: NOT DETECTED

## 2015-12-03 LAB — LIPID PANEL
CHOLESTEROL: 262 mg/dL — AB (ref 0–200)
HDL: 25 mg/dL — ABNORMAL LOW (ref >40)
LDL Cholesterol: UNDETERMINED mg/dL (ref 0–99)
Total CHOL/HDL Ratio: 10.5 RATIO
Triglycerides: 467 mg/dL — ABNORMAL HIGH (ref <150)
VLDL: UNDETERMINED mg/dL (ref 0–40)

## 2015-12-03 LAB — TROPONIN I: Troponin I: <0.03 ng/mL (ref <0.031)

## 2015-12-03 LAB — OSMOLALITY: Osmolality: 309 mOsm/kg — ABNORMAL HIGH (ref 275–295)

## 2015-12-03 MED ORDER — SODIUM CHLORIDE 0.9 % IV SOLN
INTRAVENOUS | Status: DC
  Administered 2015-12-03: 3.1 [IU]/h via INTRAVENOUS
  Filled 2015-12-03: qty 2.5

## 2015-12-03 MED ORDER — DEXTROSE-NACL 5-0.45 % IV SOLN
INTRAVENOUS | Status: DC
  Administered 2015-12-03 – 2015-12-04 (×4): via INTRAVENOUS

## 2015-12-03 MED ORDER — INSULIN ASPART 100 UNIT/ML ~~LOC~~ SOLN
5.0000 [IU] | Freq: Once | SUBCUTANEOUS | Status: AC
  Administered 2015-12-03: 5 [IU] via SUBCUTANEOUS
  Filled 2015-12-03: qty 1

## 2015-12-03 MED ORDER — INSULIN ASPART 100 UNIT/ML ~~LOC~~ SOLN: 0.0000 [IU] | SUBCUTANEOUS | Status: DC

## 2015-12-03 MED ORDER — ACETAMINOPHEN 325 MG PO TABS
650.0000 mg | ORAL_TABLET | Freq: Four times a day (QID) | ORAL | Status: DC | PRN
  Administered 2015-12-03 (×2): 650 mg via ORAL
  Filled 2015-12-03 (×2): qty 2

## 2015-12-03 MED ORDER — LIVING WELL WITH DIABETES BOOK
Freq: Once | Status: AC
  Administered 2015-12-03: 10:00:00
  Filled 2015-12-03: qty 1

## 2015-12-03 MED ORDER — SODIUM CHLORIDE 0.9 % IV BOLUS (SEPSIS)
1000.0000 mL | Freq: Once | INTRAVENOUS | Status: AC
  Administered 2015-12-03: 1000 mL via INTRAVENOUS

## 2015-12-03 MED ORDER — POTASSIUM CHLORIDE 10 MEQ/100ML IV SOLN
10.0000 meq | INTRAVENOUS | Status: AC
  Administered 2015-12-03 (×4): 10 meq via INTRAVENOUS
  Filled 2015-12-03 (×4): qty 100

## 2015-12-03 MED ORDER — SODIUM CHLORIDE 0.9 % IV SOLN
INTRAVENOUS | Status: DC
  Administered 2015-12-03 – 2015-12-05 (×2): via INTRAVENOUS

## 2015-12-03 MED ORDER — ENOXAPARIN SODIUM 40 MG/0.4ML ~~LOC~~ SOLN
40.0000 mg | Freq: Every day | SUBCUTANEOUS | Status: DC
  Administered 2015-12-03 – 2015-12-05 (×3): 40 mg via SUBCUTANEOUS
  Filled 2015-12-03 (×3): qty 0.4

## 2015-12-03 NOTE — Progress Notes (Signed)
Received report from RN in ED for transfer of pt to [phone]

## 2015-12-03 NOTE — Progress Notes (Addendum)
Inpatient Diabetes Program Recommendations  AACE/ADA: New Consensus Statement on Inpatient Glycemic Control (2015)  Target Ranges:  Prepandial:   less than 140 mg/dL      Peak postprandial:   less than 180 mg/dL (1-2 hours)      Critically ill patients:  140 - 180 mg/dL   Spoke with patient and wife about new diabetes diagnosis.  Discussed what an A1C is and informed patient that we were waiting on his A1c results. Discussed basic pathophysiology of DM Type 2, basic home care, importance of checking CBGs and maintaining good CBG control to prevent long-term and short-term complications. Reviewed glucose and A1C goals. Reviewed signs and symptoms of hyperglycemia and hypoglycemia along with treatment for both. Discussed impact of nutrition, exercise, stress, sickness, and medications on diabetes control. Reviewed Living Well with diabetes booklet and encouraged patient to read through entire book. Informed patient that he may be prescribed oral medication and insulin. Discussed how to check blood glucose with a meter.  Asked patient to check his glucose at least twice a day and to keep a log book of glucose readings and insulin taken. Explained how the doctor he follows up with can use the log book to continue to make insulin adjustments if needed. Reviewed With RN about teaching patient how to inject insulin and to check his glucose. Patient and wife verbalized understanding of information discussed. Patient and wife to review DM educational materials. RNs to provide ongoing basic DM education at bedside with this patient and engage patient to actively check blood glucose and administer insulin injections.   Patient requesting to eat. Paged Dr. Dyann Kief. Patient should be ok to eat. Glucostabilizer can cover the carbohydrates consumed while in the IV insulin.  Thanks, Tama Headings RN, MSN, [facility] Inpatient Diabetes Coordinator Team Pager [phone] (8a-5p)

## 2015-12-03 NOTE — Progress Notes (Signed)
NURSING PROGRESS NOTE  Timothy Sutton [id number] Admission Data: 12/03/2015 5:06 AM Attending Provider: Rise Patience, MD [id number] J, MD Code Status: full  Allergies:  Tomato Past Medical History:   has a past medical history of Diabetes mellitus without complication ([place]). Past Surgical History:   has past surgical history that includes Hernia repair. Social History:   reports that he has been smoking Cigarettes.  He does not have any smokeless tobacco history on file. He reports that he drinks alcohol. He reports that he does not use illicit drugs.  Timothy Sutton is a 40 y.o. male patient admitted from ED:   Last Documented Vital Signs: Blood pressure 112/56, pulse 72, temperature 98 F (36.7 C), temperature source Oral, resp. rate 24, height 5\' 9"  (1.753 m), weight 124.694 kg (274 lb 14.4 oz), SpO2 100 %.  Cardiac Monitoring: Box # 01 in place. Cardiac monitor yields:normal sinus rhythm.  IV Fluids:  IV in place, occlusive dsg intact without redness, IV cath forearm right, condition patent and no redness and antecubital right, condition patent and no redness normal saline.   Skin: WDL  Patient/Family orientated to room. Information packet given to patient/family. Admission inpatient armband information verified with patient/family to include name and date of birth and placed on patient arm. Side rails up x 2, fall assessment and education completed with patient/family. Patient/family able to verbalize understanding of risk associated with falls and verbalized understanding to call for assistance before getting out of bed. Call light within reach. Patient/family able to voice and demonstrate understanding of unit orientation instructions.    Will continue to evaluate and treat per MD orders.   Amaryllis Dyke, RN

## 2015-12-03 NOTE — Progress Notes (Addendum)
Inpatient Diabetes Program Recommendations  AACE/ADA: New Consensus Statement on Inpatient Glycemic Control (2015)  Target Ranges:  Prepandial:   less than 140 mg/dL      Peak postprandial:   less than 180 mg/dL (1-2 hours)      Critically ill patients:  140 - 180 mg/dL   Lab Results  Component Value Date   GLUCAP 179* 12/03/2015    Review of Glycemic Control  Diabetes history: New Diagnosis this admission Current orders for Inpatient glycemic control: IV insulin gtt  Inpatient Diabetes Program Recommendations:   [id number] labs CO2 was 14, still acidotic. Recommend keeping patient on IV insulin until CO2 normalizes. When it is appropriate to transition patient, please consider ordering Lantus 18 units (0.15 units/kg) Q24hrs. Also consider Novolog Sensitive + HS scale.  Patient has family history of DM. Will speak with patient today on new diagnosis. RN's to teach patient survival skills to patient prior to discharge. Have ordered the Living Well With DM book, DM videos for patient. Awaiting A1c results to pinpoint outpatient regimen.  Thanks,  Tama Headings RN, MSN, [facility] Inpatient Diabetes Coordinator Team Pager [phone] (8a-5p)

## 2015-12-03 NOTE — Progress Notes (Signed)
Patient seen and examined. Admitted after midnight secondary to DKA. Newly diagnosed diabetes. Patient with family history significant for father and brother with diabetes. No nausea, no vomiting and no CP. Patient with good response to IV insulin; but still acidotic Bicarb 16. Please refer to H&P written by Dr. Hal Hope for further info/details on admission.  Plan: -continue IV insulin, follow BMET and transition when appropriate  -Anion GAP is close and CBG's < 200 X4; but Bicarb still has bot reach at least 18-19 range. -will continue IVF's and electrolytes repletion -diabetes coordinator assistance and education provided to patient appreciated -A1C pending   [place] Dubois [id number]

## 2015-12-03 NOTE — ED Notes (Signed)
CBG was 326, RN notified

## 2015-12-03 NOTE — Care Management Note (Signed)
Case Management Note  Patient Details  Name: Timothy Sutton MRN: [id number] Date of Birth: [birth year]  Subjective/Objective:                 Spoke with patient in the room. He has a new diagnosis of DM. He has seen Diabetic Ed and is has Multimedia programmer for supplies. Spoke with him about Reli-on meter at [facility] if he needed a less expensive option than what may be provided through his insurance. Patient has PCP [facility] clinic, and is independent in his own care.   Action/Plan:   Expected Discharge Date:                  Expected Discharge Plan:  Home/Self Care  In-House Referral:     Discharge planning Services  CM Consult  Post Acute Care Choice:  NA Choice offered to:     DME Arranged:    DME Agency:     HH Arranged:    HH Agency:     Status of Service:  Completed, signed off  Medicare Important Message Given:    Date Medicare IM Given:    Medicare IM give by:    Date Additional Medicare IM Given:    Additional Medicare Important Message give by:     If discussed at Early of Stay Meetings, dates discussed:    Additional Comments:  Carles Collet, RN 12/03/2015, 3:24 PM

## 2015-12-03 NOTE — H&P (Signed)
History and Physical    Timothy Sutton [id number] DOB: [birth year] DOA: 12/02/2015  PCP: Elyn Peers, MD  Patient coming from: Home.  Chief Complaint: Weakness. Polyuria polydipsia.  HPI: Timothy Sutton is a 40 y.o. male with no significant past medical history presents to the ER because of polyuria polydipsia and weakness. Patient has been having these symptoms for last 2-3 weeks. Denies any chest pain or shortness of breath. Patient is found to have elevated blood sugar with anion gap and labs are consistent with DKA. UA shows ketones. Patient has been started on IV fluid infusion with bolus and IV insulin infusion for DKA.   ED Course: Patient was started on IV fluid bolus and IV fluid infusion.  Review of Systems: As per HPI otherwise 10 point review of systems negative.    Past Medical History  Diagnosis Date  . Diabetes mellitus without complication [facility])     Past Surgical History  Procedure Laterality Date  . Hernia repair       reports that he has been smoking Cigarettes.  He does not have any smokeless tobacco history on file. He reports that he drinks alcohol. He reports that he does not use illicit drugs.  Allergies  Allergen Reactions  . Tomato     reflux    Family History  Problem Relation Age of Onset  . Leukemia Father   . Diabetes Father   . Diabetes Other     Prior to Admission medications   Medication Sig Start Date End Date Taking? Authorizing Provider  ibuprofen (ADVIL,MOTRIN) 400 MG tablet Take 400 mg by mouth 2 (two) times daily.   Yes Historical Provider, MD  Multiple Vitamin (MULTIVITAMIN) LIQD Take 5 mLs by mouth daily.   Yes Historical Provider, MD    Physical Exam: Filed Vitals:   12/02/15 2359 12/03/15 0000 12/03/15 0100 12/03/15 0200  BP: 134/94 135/82 115/87 126/63  Pulse: 75 72    Temp:      TempSrc:      Resp: 20     Weight: 268 lb 11.2 oz (121.882 kg)     SpO2: 100% 100%        Constitutional: Not in  distress. Filed Vitals:   12/02/15 2359 12/03/15 0000 12/03/15 0100 12/03/15 0200  BP: 134/94 135/82 115/87 126/63  Pulse: 75 72    Temp:      TempSrc:      Resp: 20     Weight: 268 lb 11.2 oz (121.882 kg)     SpO2: 100% 100%     Eyes: Anicteric. No pallor. ENMT: No discharge from the ears eyes nose or mouth. Neck: No neck rigidity no mass felt. Respiratory: No rhonchi or crepitations.  Cardiovascular: S1-S2 heard. Abdomen: Soft nontender bowel sounds present. Musculoskeletal: No edema. Skin: No rash. Neurologic: Alert awake oriented to time place and person. Moves all extremities. Psychiatric: Appears normal.   Labs on Admission: I have personally reviewed following labs and imaging studies  CBC:  Recent Labs Lab 12/02/15 2029 12/02/15 2115  WBC  --  7.9  HGB 18.4* 16.5  HCT 54.0* 46.5  MCV  --  80.3  PLT  --  [id number]   Basic Metabolic Panel:  Recent Labs Lab 12/02/15 2029 12/02/15 2115  NA 133* 129*  K 4.3 4.4  CL 103 100*  CO2  --  12*  GLUCOSE 512* 491*  BUN 19 15  CREATININE 1.00 1.46*  CALCIUM  --  9.3   GFR: CrCl  cannot be calculated (Unknown ideal weight.). Liver Function Tests: No results for input(s): AST, ALT, ALKPHOS, BILITOT, PROT, ALBUMIN in the last 168 hours. No results for input(s): LIPASE, AMYLASE in the last 168 hours. No results for input(s): AMMONIA in the last 168 hours. Coagulation Profile: No results for input(s): INR, PROTIME in the last 168 hours. Cardiac Enzymes: No results for input(s): CKTOTAL, CKMB, CKMBINDEX, TROPONINI in the last 168 hours. BNP (last 3 results) No results for input(s): PROBNP in the last 8760 hours. HbA1C: No results for input(s): HGBA1C in the last 72 hours. CBG:  Recent Labs Lab 12/03/15 0018 12/03/15 0057 12/03/15 0256  GLUCAP 433* 326* 315*   Lipid Profile: No results for input(s): CHOL, HDL, LDLCALC, TRIG, CHOLHDL, LDLDIRECT in the last 72 hours. Thyroid Function Tests: No results for  input(s): TSH, T4TOTAL, FREET4, T3FREE, THYROIDAB in the last 72 hours. Anemia Panel: No results for input(s): VITAMINB12, FOLATE, FERRITIN, TIBC, IRON, RETICCTPCT in the last 72 hours. Urine analysis:    Component Value Date/Time   COLORURINE YELLOW 12/02/2015 2116   APPEARANCEUR CLEAR 12/02/2015 2116   LABSPEC 1.010 12/02/2015 2116   PHURINE 5.5 12/02/2015 2116   GLUCOSEU >1000* 12/02/2015 2116   HGBUR TRACE* 12/02/2015 2116   BILIRUBINUR NEGATIVE 12/02/2015 2116   KETONESUR >80* 12/02/2015 2116   PROTEINUR 30* 12/02/2015 2116   UROBILINOGEN 0.2 12/02/2015 2021   NITRITE NEGATIVE 12/02/2015 2116   LEUKOCYTESUR NEGATIVE 12/02/2015 2116   Sepsis Labs: @LABRCNTIP (procalcitonin:4,lacticidven:4) )No results found for this or any previous visit (from the past 240 hour(s)).   Radiological Exams on Admission: No results found.  EKG - Normal sinus rhythm with probable LVH. Nonspecific T wave changes.  Assessment/Plan Principal Problem:   DKA, type 2 ([place]) Active Problems:   ARF (acute renal failure) (HCC)   Hyponatremia    #1. Diabetic ketoacidosis and new-onset diabetes mellitus probably type II - patient has been started on fluid bolus and IV fluid infusion and IV insulin infusion. Closely follow metabolic panel and once anion gap gets corrected change to long-acting insulin. Check hemoglobin A1c and lipid panel. Will need diabetic education. #2. Acute renal failure probably from dehydration secondary to diabetes ketoacidosis - I think patient's renal function will improve with correction of dehydration. Closely follow intake output metabolic panel. #3. Hyponatremia probably secondary to dehydration and DKA which I think will improve with correction of DKA. #4. Tobacco abuse - patient strongly advised to quit smoking.  Chest x-ray is pending.   DVT prophylaxis: Lovenox. Code Status: Full code.  Family Communication: Wife at the bedside.  Disposition Plan: Home.  Consults  called: None.  Admission status: Telemetry. Observation.    Rise Patience MD [facility] Pager [phone].  If 7PM-7AM, please contact night-coverage [email or web address] Password [email or web address]  12/03/2015, 3:06 AM

## 2015-12-04 DIAGNOSIS — R739 Hyperglycemia, unspecified: Secondary | ICD-10-CM | POA: Diagnosis not present

## 2015-12-04 DIAGNOSIS — N179 Acute kidney failure, unspecified: Secondary | ICD-10-CM | POA: Diagnosis not present

## 2015-12-04 DIAGNOSIS — E785 Hyperlipidemia, unspecified: Secondary | ICD-10-CM

## 2015-12-04 DIAGNOSIS — E1310 Other specified diabetes mellitus with ketoacidosis without coma: Secondary | ICD-10-CM | POA: Diagnosis not present

## 2015-12-04 DIAGNOSIS — E871 Hypo-osmolality and hyponatremia: Secondary | ICD-10-CM | POA: Diagnosis not present

## 2015-12-04 DIAGNOSIS — E1311 Other specified diabetes mellitus with ketoacidosis with coma: Secondary | ICD-10-CM | POA: Diagnosis not present

## 2015-12-04 DIAGNOSIS — E860 Dehydration: Secondary | ICD-10-CM | POA: Diagnosis not present

## 2015-12-04 LAB — BASIC METABOLIC PANEL
ANION GAP: 7 (ref 5–15)
CO2: 20 mmol/L — ABNORMAL LOW (ref 22–32)
Calcium: 8.8 mg/dL — ABNORMAL LOW (ref 8.9–10.3)
Chloride: 108 mmol/L (ref 101–111)
Creatinine, Ser: 0.78 mg/dL (ref 0.61–1.24)
Glucose, Bld: 182 mg/dL — ABNORMAL HIGH (ref 65–99)
POTASSIUM: 3.8 mmol/L (ref 3.5–5.1)
SODIUM: 135 mmol/L (ref 135–145)

## 2015-12-04 LAB — GLUCOSE, CAPILLARY
GLUCOSE-CAPILLARY: 116 mg/dL — AB (ref 65–99)
GLUCOSE-CAPILLARY: 150 mg/dL — AB (ref 65–99)
GLUCOSE-CAPILLARY: 168 mg/dL — AB (ref 65–99)
GLUCOSE-CAPILLARY: 217 mg/dL — AB (ref 65–99)
GLUCOSE-CAPILLARY: 227 mg/dL — AB (ref 65–99)
Glucose-Capillary: 166 mg/dL — ABNORMAL HIGH (ref 65–99)
Glucose-Capillary: 166 mg/dL — ABNORMAL HIGH (ref 65–99)
Glucose-Capillary: 180 mg/dL — ABNORMAL HIGH (ref 65–99)
Glucose-Capillary: 196 mg/dL — ABNORMAL HIGH (ref 65–99)
Glucose-Capillary: 147 mg/dL — ABNORMAL HIGH (ref 65–99)
Glucose-Capillary: 300 mg/dL — ABNORMAL HIGH (ref 65–99)
Glucose-Capillary: 363 mg/dL — ABNORMAL HIGH (ref 65–99)

## 2015-12-04 LAB — HEMOGLOBIN A1C
Hgb A1c MFr Bld: 10.8 % — ABNORMAL HIGH (ref 4.8–5.6)
Mean Plasma Glucose: 263 mg/dL

## 2015-12-04 LAB — LDL CHOLESTEROL, DIRECT: LDL DIRECT: 189 mg/dL — AB (ref 0–99)

## 2015-12-04 MED ORDER — INSULIN ASPART 100 UNIT/ML ~~LOC~~ SOLN
0.0000 [IU] | Freq: Three times a day (TID) | SUBCUTANEOUS | Status: DC
  Administered 2015-12-04: 8 [IU] via SUBCUTANEOUS
  Administered 2015-12-04: 2 [IU] via SUBCUTANEOUS
  Administered 2015-12-05: 8 [IU] via SUBCUTANEOUS

## 2015-12-04 MED ORDER — INSULIN ASPART 100 UNIT/ML ~~LOC~~ SOLN
0.0000 [IU] | Freq: Every day | SUBCUTANEOUS | Status: DC
  Administered 2015-12-04: 5 [IU] via SUBCUTANEOUS

## 2015-12-04 MED ORDER — INSULIN GLARGINE 100 UNIT/ML ~~LOC~~ SOLN
18.0000 [IU] | Freq: Every day | SUBCUTANEOUS | Status: DC
  Administered 2015-12-04: 18 [IU] via SUBCUTANEOUS
  Filled 2015-12-04: qty 0.18

## 2015-12-04 MED ORDER — SIMVASTATIN 20 MG PO TABS: 20.0000 mg | ORAL_TABLET | Freq: Every day | ORAL | Status: DC

## 2015-12-04 MED ORDER — INSULIN GLARGINE 100 UNIT/ML ~~LOC~~ SOLN
7.0000 [IU] | Freq: Once | SUBCUTANEOUS | Status: AC
  Administered 2015-12-04: 7 [IU] via SUBCUTANEOUS
  Filled 2015-12-04: qty 0.07

## 2015-12-04 MED ORDER — INSULIN GLARGINE 100 UNIT/ML ~~LOC~~ SOLN
25.0000 [IU] | Freq: Every day | SUBCUTANEOUS | Status: DC
  Administered 2015-12-05: 25 [IU] via SUBCUTANEOUS
  Filled 2015-12-04: qty 0.25

## 2015-12-04 MED ORDER — OMEGA-3-ACID ETHYL ESTERS 1 G PO CAPS
1.0000 g | ORAL_CAPSULE | Freq: Two times a day (BID) | ORAL | Status: DC
  Administered 2015-12-04 – 2015-12-05 (×2): 1 g via ORAL
  Filled 2015-12-04 (×2): qty 1

## 2015-12-04 NOTE — Progress Notes (Signed)
[facility] PROGRESS NOTE  Timothy ROUSER [id number] DOB: 1975/07/30 DOA: 12/02/2015 PCP: Elyn Peers, MD  Interim summary and HPI 40 y.o. male with no significant past medical history presents to the ER because of polyuria polydipsia and weakness. Patient has been having these symptoms for last 2-3 weeks. Denies any chest pain or shortness of breath. Patient is found to have elevated blood sugar with anion gap and labs are consistent with DKA. UA shows ketones. Patient has been started on IV fluid infusion with bolus and IV insulin infusion for DKA.   DKA is now resolved. Transitioned off IV insulin and adjusting hypoglycemic regimen prior to discharge. AKI also resolved.  Assessment/Plan: 1-DKA: type 2. Newly diagnosed diabetes -A1C 10.8 -currently on SSI and lantus -DKA resolved -will follow CBG's over the next 24 hours and adjust insulin therapy as needed -advise to maintain adequate hydration and to follow low carb diet  2-AKI: pre-renal in nature secondary to dehydration from DKA -resolved with IVF's -will monitor trend  3-hyponatremia: due to dehydration and hyperglycemia: -resolved after controlling CBG's and given IVF's  4-HLD and elevated TG: -will initiate treatment with zocor and lovaza  5-Tobacco abuse: -cessation counseling provided  Code Status: Full code Family Communication: brother and wife at bedside  Disposition Plan: anticipate discharge home in the next 24 hours or so   Consultants:  Diabetes coordinator   Procedures:  See below for x-ray reports   Antibiotics:  None   HPI/Subjective: Afebrile, no CP, no SOB. Feeling better overall. Off insulin drip now and adjusting hypoglycemic regimen   Objective: Filed Vitals:   12/04/15 1115 12/04/15 1348  BP: 127/65 134/63  Pulse: 60 75  Temp: 97.9 F (36.6 C) 98.6 F (37 C)  Resp: 18 18    Intake/Output Summary (Last 24 hours) at 12/04/15 1835 Last data filed at 12/04/15 1200  Gross per 24 hour  Intake 1604.17 ml  Output    450 ml  Net 1154.17 ml   Filed Weights   12/02/15 2359 12/03/15 0351  Weight: 121.882 kg (268 lb 11.2 oz) 124.694 kg (274 lb 14.4 oz)    Exam:   General:  Feeling better and denying any acute complaints. Reports thirst and polyuria has significantly improved   Cardiovascular: S1 and S2, no rubs or gallops   Respiratory: CTA bilaterally   Abdomen: soft, NT, ND, positive BS  Musculoskeletal: no edema, no cyanosis   Data Reviewed: Basic Metabolic Panel:  Recent Labs Lab 12/03/15 0325 12/03/15 0641 12/03/15 1114 12/03/15 1508 12/04/15 0515  NA 134* 135 135 135 135  K 3.9 4.2 3.4* 3.2* 3.8  CL 108 111 112* 110 108  CO2 14* 14* 16* 17* 20*  GLUCOSE 338* 262* 163* 177* 182*  BUN 13 11 8 8  <5*  CREATININE 1.37* 1.08 0.90 0.91 0.78  CALCIUM 8.4* 8.4* 8.5* 8.7* 8.8*   CBC:  Recent Labs Lab 12/02/15 2029 12/02/15 2115 12/03/15 0325  WBC  --  7.9 6.2  HGB 18.4* 16.5 14.0  HCT 54.0* 46.5 41.1  MCV  --  80.3 81.1  PLT  --  307 243   Cardiac Enzymes:  Recent Labs Lab 12/03/15 0325  TROPONINI <0.03   CBG:  Recent Labs Lab 12/04/15 0723 12/04/15 0834 12/04/15 0947 12/04/15 1223 12/04/15 1732  GLUCAP 147* 116* 217* 227* 300*   Studies: Dg Chest Port 1 View  12/03/2015  CLINICAL DATA:  DKA. EXAM: PORTABLE CHEST 1 VIEW COMPARISON:  03/13/2012. FINDINGS: The heart size and  mediastinal contours are within normal limits. Both lungs are clear. The visualized skeletal structures are unremarkable. IMPRESSION: No active disease. Electronically Signed   By: Marcello Moores  Register   On: 12/03/2015 08:09    Scheduled Meds: . enoxaparin (LOVENOX) injection  40 mg Subcutaneous Q0600  . insulin aspart  0-15 Units Subcutaneous TID WC  . insulin aspart  0-5 Units Subcutaneous QHS  . [START ON 12/05/2015] insulin glargine  25 Units Subcutaneous Daily  . insulin glargine  7 Units Subcutaneous Once   Continuous Infusions: . sodium  chloride 100 mL/hr at 12/04/15 0903  . insulin (NOVOLIN-R) infusion Stopped (12/04/15 0904)    Principal Problem:   DKA, type 2 ([facility]) Active Problems:   ARF (acute renal failure) ([place])   Hyponatremia    Time spent: 30 minutes    Barton Dubois  [facility] Pager [phone]. If 7PM-7AM, please contact night-coverage at [email or web address], password [facility] 12/04/2015, 6:35 PM

## 2015-12-05 DIAGNOSIS — N179 Acute kidney failure, unspecified: Secondary | ICD-10-CM | POA: Diagnosis not present

## 2015-12-05 DIAGNOSIS — R739 Hyperglycemia, unspecified: Secondary | ICD-10-CM | POA: Diagnosis not present

## 2015-12-05 DIAGNOSIS — E860 Dehydration: Secondary | ICD-10-CM | POA: Insufficient documentation

## 2015-12-05 DIAGNOSIS — R7309 Other abnormal glucose: Secondary | ICD-10-CM

## 2015-12-05 DIAGNOSIS — E1311 Other specified diabetes mellitus with ketoacidosis with coma: Secondary | ICD-10-CM | POA: Diagnosis not present

## 2015-12-05 DIAGNOSIS — E1169 Type 2 diabetes mellitus with other specified complication: Secondary | ICD-10-CM

## 2015-12-05 DIAGNOSIS — E1165 Type 2 diabetes mellitus with hyperglycemia: Secondary | ICD-10-CM

## 2015-12-05 DIAGNOSIS — E1110 Type 2 diabetes mellitus with ketoacidosis without coma: Secondary | ICD-10-CM | POA: Insufficient documentation

## 2015-12-05 DIAGNOSIS — E785 Hyperlipidemia, unspecified: Secondary | ICD-10-CM | POA: Insufficient documentation

## 2015-12-05 LAB — CBC
HCT: 36.3 % — ABNORMAL LOW (ref 39.0–52.0)
Hemoglobin: 12.5 g/dL — ABNORMAL LOW (ref 13.0–17.0)
MCH: 26.7 pg (ref 26.0–34.0)
MCHC: 34.4 g/dL (ref 30.0–36.0)
MCV: 77.6 fL — AB (ref 78.0–100.0)
PLATELETS: 215 10*3/uL (ref 150–400)
RBC: 4.68 MIL/uL (ref 4.22–5.81)
RDW: 14 % (ref 11.5–15.5)
WBC: 5 10*3/uL (ref 4.0–10.5)

## 2015-12-05 LAB — BASIC METABOLIC PANEL
Anion gap: 7 (ref 5–15)
BUN: 5 mg/dL — AB (ref 6–20)
CALCIUM: 8.7 mg/dL — AB (ref 8.9–10.3)
CO2: 23 mmol/L (ref 22–32)
CREATININE: 0.86 mg/dL (ref 0.61–1.24)
Chloride: 108 mmol/L (ref 101–111)
GFR calc Af Amer: >60 mL/min (ref >60)
GLUCOSE: 239 mg/dL — AB (ref 65–99)
Potassium: 3.4 mmol/L — ABNORMAL LOW (ref 3.5–5.1)
Sodium: 138 mmol/L (ref 135–145)

## 2015-12-05 LAB — GLUCOSE, CAPILLARY
Glucose-Capillary: 259 mg/dL — ABNORMAL HIGH (ref 65–99)
Glucose-Capillary: 210 mg/dL — ABNORMAL HIGH (ref 65–99)

## 2015-12-05 MED ORDER — OMEGA-3-ACID ETHYL ESTERS 1 G PO CAPS: 2.0000 g | ORAL_CAPSULE | Freq: Every day | ORAL | Status: AC

## 2015-12-05 MED ORDER — METFORMIN HCL 500 MG PO TABS: 500.0000 mg | ORAL_TABLET | Freq: Two times a day (BID) | ORAL | Status: DC

## 2015-12-05 MED ORDER — POTASSIUM CHLORIDE CRYS ER 20 MEQ PO TBCR
40.0000 meq | EXTENDED_RELEASE_TABLET | ORAL | Status: AC
  Administered 2015-12-05 (×2): 40 meq via ORAL
  Filled 2015-12-05 (×2): qty 2

## 2015-12-05 MED ORDER — SIMVASTATIN 20 MG PO TABS: 20.0000 mg | ORAL_TABLET | Freq: Every day | ORAL | Status: DC

## 2015-12-05 MED ORDER — INSULIN GLARGINE 100 UNIT/ML SOLOSTAR PEN: 25.0000 [IU] | PEN_INJECTOR | Freq: Every day | SUBCUTANEOUS | Status: DC

## 2015-12-05 MED ORDER — INSULIN PEN NEEDLE 31G X 6 MM MISC: Status: DC

## 2015-12-05 MED ORDER — IBUPROFEN 400 MG PO TABS: 400.0000 mg | ORAL_TABLET | Freq: Two times a day (BID) | ORAL | Status: AC | PRN

## 2015-12-05 MED ORDER — LANCETS 30G MISC: Status: DC

## 2015-12-05 NOTE — Discharge Summary (Signed)
Physician Discharge Summary  Timothy Sutton [id number] DOB: [birth year] DOA: 12/02/2015  PCP: Elyn Peers, MD  Admit date: 12/02/2015 Discharge date: 12/05/2015  Time spent: 35 minutes  Recommendations for Outpatient Follow-up:  Repeat BMET to follow electrolytes and renal function  Repeat lipid panel and LFT's in 3 months Close follow up to patient's CBG's/diabetes and further adjustments to his hypoglycemic regimen as needed   Discharge Diagnoses:  Principal Problem:   DKA, type 2 ([place]) Active Problems:   ARF (acute renal failure) ([place])   Hyponatremia Obesity HLD Elevated triglycerides Hypokalemia   Discharge Condition: stable and improved. Discharge with instruction to take medications as prescribed and to follow up with PCP on 12/15/15  Diet recommendation: low fat and low carbohydrates diet   Filed Weights   12/02/15 2359 12/03/15 0351  Weight: 121.882 kg (268 lb 11.2 oz) 124.694 kg (274 lb 14.4 oz)    History of present illness:  40 y.o. male with no significant past medical history presents to the ER because of polyuria polydipsia and weakness. Patient has been having these symptoms for last 2-3 weeks. Denies any chest pain or shortness of breath. Patient is found to have elevated blood sugar with anion gap and labs are consistent with DKA. UA shows ketones. Patient has been started on IV fluid infusion with bolus and IV insulin infusion for DKA.   Hospital Course:  1-DKA: type 2. Newly diagnosed diabetes -A1C 10.8 -DKA resolved -will discharge home on lantus and metformin  -advise to maintain adequate hydration and to follow low carb diet -close follow up with PCP for further adjustments on his hypoglycemic regimen   2-AKI: pre-renal in nature secondary to dehydration from DKA -resolved with IVF's -will recommend BMET to follow electrolytes and renal function trend at follow up visit   3-hyponatremia: due to dehydration and hyperglycemia: -resolved after  controlling CBG's and given IVF's -advise to maintain good hydration   4-HLD and elevated TG: -will initiate treatment with zocor and lovaza -needs FLP and LFT in 3 months  5-Tobacco abuse: -cessation counseling provided  6-obesity -Body mass index is 40.58 kg/(m^2). -weight loss and increase physical therapy/exercise advised  Procedures:  See below for x-ray reports   Consultations:  Diabetes coordinator   Discharge Exam: Filed Vitals:   12/05/15 0214 12/05/15 0500  BP: 118/64 119/67  Pulse: 62 60  Temp: 98.4 F (36.9 C) 98.4 F (36.9 C)  Resp: 16 17    General: Feeling much better and denying any acute complaints. Reports thirst and polyuria has significantly improved/essentially resolved.   Cardiovascular: S1 and S2, no rubs or gallops   Respiratory: CTA bilaterally   Abdomen: soft, NT, ND, positive BS  Musculoskeletal: no edema, no cyanosis  Discharge Instructions   Discharge Instructions    Diet - low sodium heart healthy    Complete by:  As directed      Discharge instructions    Complete by:  As directed   Check blood sugar three times a day and chart log (fasting in am, lunch and at bedtime) Follow low fat and low carbohydrates diet  Lose weight and increase physical activity Stop smoking Take medications as prescribed Keep yourself well hydrated Follow up with PCP as already scheduled 12/15/15     For home use only DME Glucometer    Complete by:  As directed           Current Discharge Medication List    START taking these medications   Details  Insulin Glargine (LANTUS) 100 UNIT/ML Solostar Pen Inject 25 Units into the skin at bedtime. 9 pm Qty: 15 mL, Refills: 3    Insulin Pen Needle 31G X 6 MM MISC Use daily to inject insulin as instructed Qty: 150 each, Refills: 3    Lancets 30G MISC Use to check blood sugar three times daily (fasting in am; around lunch and at bedtime) Qty: 150 each, Refills: 3    metFORMIN (GLUCOPHAGE) 500  MG tablet Take 1 tablet (500 mg total) by mouth 2 (two) times daily with a meal. Qty: 60 tablet, Refills: 2    omega-3 acid ethyl esters (LOVAZA) 1 g capsule Take 2 capsules (2 g total) by mouth daily. Qty: 30 capsule, Refills: 3    simvastatin (ZOCOR) 20 MG tablet Take 1 tablet (20 mg total) by mouth daily at 6 PM. Qty: 30 tablet, Refills: 1      CONTINUE these medications which have CHANGED   Details  ibuprofen (ADVIL,MOTRIN) 400 MG tablet Take 1 tablet (400 mg total) by mouth every 12 (twelve) hours as needed for moderate pain.      CONTINUE these medications which have NOT CHANGED   Details  Multiple Vitamin (MULTIVITAMIN) LIQD Take 5 mLs by mouth daily.       Allergies  Allergen Reactions  . Tomato     reflux   Follow-up Information    Follow up with Elyn Peers, MD On 12/15/2015.   Specialty:  Family Medicine   Contact information:   [address] [place] [address] [phone]       The results of significant diagnostics from this hospitalization (including imaging, microbiology, ancillary and laboratory) are listed below for reference.    Significant Diagnostic Studies: Dg Chest Port 1 View  12/03/2015  CLINICAL DATA:  DKA. EXAM: PORTABLE CHEST 1 VIEW COMPARISON:  03/13/2012. FINDINGS: The heart size and mediastinal contours are within normal limits. Both lungs are clear. The visualized skeletal structures are unremarkable. IMPRESSION: No active disease. Electronically Signed   By: Marcello Moores  Register   On: 12/03/2015 08:09    Microbiology: No results found for this or any previous visit (from the past 240 hour(s)).   Labs: Basic Metabolic Panel:  Recent Labs Lab 12/03/15 0641 12/03/15 1114 12/03/15 1508 12/04/15 0515 12/05/15 0459  NA 135 135 135 135 138  K 4.2 3.4* 3.2* 3.8 3.4*  CL 111 112* 110 108 108  CO2 14* 16* 17* 20* 23  GLUCOSE 262* 163* 177* 182* 239*  BUN 11 8 8  <5* 5*  CREATININE 1.08 0.90 0.91 0.78 0.86  CALCIUM 8.4* 8.5*  8.7* 8.8* 8.7*   CBC:  Recent Labs Lab 12/02/15 2029 12/02/15 2115 12/03/15 0325 12/05/15 0459  WBC  --  7.9 6.2 5.0  HGB 18.4* 16.5 14.0 12.5*  HCT 54.0* 46.5 41.1 36.3*  MCV  --  80.3 81.1 77.6*  PLT  --  307 243 215   Cardiac Enzymes:  Recent Labs Lab 12/03/15 0325  TROPONINI <0.03    CBG:  Recent Labs Lab 12/04/15 0947 12/04/15 1223 12/04/15 1732 12/04/15 2211 12/05/15 0757  GLUCAP 217* 227* 300* 363* 259*     Signed:  Barton Dubois MD.  [facility] 12/05/2015, 11:22 AM

## 2015-12-05 NOTE — Progress Notes (Signed)
Patient was discharged home by MD order; discharged instructions review and give to patient with care notes and prescriptions; IV DIC; skin intact; patient will be escorted to the car by nurse tech via wheelchair.  

## 2016-01-26 ENCOUNTER — Encounter: Payer: Self-pay | Admitting: Skilled Nursing Facility1

## 2016-01-26 ENCOUNTER — Encounter: Payer: Commercial Managed Care - HMO | Attending: Family Medicine | Admitting: Skilled Nursing Facility1

## 2016-01-26 DIAGNOSIS — E119 Type 2 diabetes mellitus without complications: Secondary | ICD-10-CM | POA: Diagnosis present

## 2016-01-26 NOTE — Progress Notes (Signed)
Diabetes Self-Management Education  Visit Type: First/Initial  Appt. Start Time: 8:00 Appt. End Time: 8:50  01/26/2016  [name], identified by name and date of birth, is a 40 y.o. male with a diagnosis of Diabetes: Type 2.   ASSESSMENT  Height 5\' 9"  (1.753 m), weight 266 lb (120.7 kg). Body mass index is 39.28 kg/m.  Pt is tired from working third shift but he was attentive. Pt states he saw his father and brother die from diabetes which has motivated him to take his disease seriously. The appointment was spent educating the pt on his risk for hypoglycemia and the importance of eating enough carbohydrate especially with his workout routine. Pt states he knew he had diabetes after a bought of blurred vision which has cleared from lowering his blood glucose readings.  Pt states he was afraid to eat carbohydrates due to his associations of diabetes.       Diabetes Self-Management Education - 01/26/16 0813      Visit Information   Visit Type First/Initial     Initial Visit   Diabetes Type Type 2   Are you currently following a meal plan? No   Are you taking your medications as prescribed? Yes   Date Diagnosed 12/03/2015     Health Coping   How would you rate your overall health? Fair     Psychosocial Assessment   Patient Belief/Attitude about Diabetes Motivated to manage diabetes   Self-care barriers None   Self-management support Family   Patient Concerns Nutrition/Meal planning;Glycemic Control   Special Needs None   How often do you need to have someone help you when you read instructions, pamphlets, or other written materials from your doctor or pharmacy? 1 - Never   What is the last grade level you completed in school? 12th     Pre-Education Assessment   Patient understands the diabetes disease and treatment process. Needs Instruction   Patient understands incorporating nutritional management into lifestyle. Needs Instruction   Patient undertands incorporating  physical activity into lifestyle. Needs Instruction   Patient understands using medications safely. Needs Instruction   Patient understands monitoring blood glucose, interpreting and using results Needs Instruction   Patient understands prevention, detection, and treatment of acute complications. Needs Instruction   Patient understands prevention, detection, and treatment of chronic complications. Needs Instruction   Patient understands how to develop strategies to address psychosocial issues. Needs Instruction   Patient understands how to develop strategies to promote health/change behavior. Needs Instruction     Complications   Last HgB A1C per patient/outside source 10.3 %   How often do you check your blood sugar? 3-4 times/day   Fasting Blood glucose range (mg/dL) <70   Postprandial Blood glucose range (mg/dL) 70-129   Number of hypoglycemic episodes per month 0   Number of hyperglycemic episodes per week 0   Have you had a dilated eye exam in the past 12 months? Yes   Have you had a dental exam in the past 12 months? No  will set an appointment    Are you checking your feet? Yes   How many days per week are you checking your feet? 3     Dietary Intake   Breakfast salad, carrots, glucerna shake   Snack (morning) popcorn   Lunch steak, brown rice, salad-----salad   Snack (afternoon) carrots   Beverage(s) water, green tea     Exercise   How many days per week to you exercise? 3   How  many minutes per day do you exercise? 60   Total minutes per week of exercise 180     Patient Education   Previous Diabetes Education No   Nutrition management  Role of diet in the treatment of diabetes and the relationship between the three main macronutrients and blood glucose level;Food label reading, portion sizes and measuring food.;Carbohydrate counting;Meal timing in regards to the patients' current diabetes medication.   Physical activity and exercise  Role of exercise on diabetes  management, blood pressure control and cardiac health.;Identified with patient nutritional and/or medication changes necessary with exercise.   Monitoring Yearly dilated eye exam;Daily foot exams   Chronic complications Retinopathy and reason for yearly dilated eye exams;Assessed and discussed foot care and prevention of foot problems     Individualized Goals (developed by patient)   Nutrition Follow meal plan discussed;General guidelines for healthy choices and portions discussed;Adjust meds/carbs with exercise as discussed   Physical Activity Exercise 3-5 times per week;60 minutes per day   Medications take my medication as prescribed     Post-Education Assessment   Patient understands incorporating nutritional management into lifestyle. Demonstrates understanding / competency   Patient undertands incorporating physical activity into lifestyle. Demonstrates understanding / competency   Patient understands using medications safely. Demonstrates understanding / competency   Patient understands monitoring blood glucose, interpreting and using results Demonstrates understanding / competency   Patient understands prevention, detection, and treatment of acute complications. Demonstrates understanding / competency   Patient understands prevention, detection, and treatment of chronic complications. Demonstrates understanding / competency     Outcomes   Expected Outcomes Demonstrated interest in learning. Expect positive outcomes   Future DMSE PRN   Program Status Completed      Individualized Plan for Diabetes Self-Management Training:   Learning Objective:  Patient will have a greater understanding of diabetes self-management. Patient education plan is to attend individual and/or group sessions per assessed needs and concerns.   Plan:   There are no Patient Instructions on file for this visit.  Expected Outcomes:  Demonstrated interest in learning. Expect positive outcomes  Education  material provided: Living Well with Diabetes, Meal plan card, My Plate and Snack sheet  If problems or questions, patient to contact team via:  Phone  Future DSME appointment: PRN

## 2016-03-01 ENCOUNTER — Encounter: Payer: Self-pay | Admitting: Family Medicine

## 2016-08-18 DIAGNOSIS — E119 Type 2 diabetes mellitus without complications: Secondary | ICD-10-CM | POA: Diagnosis not present

## 2016-11-15 DIAGNOSIS — E782 Mixed hyperlipidemia: Secondary | ICD-10-CM | POA: Diagnosis not present

## 2016-11-15 DIAGNOSIS — E119 Type 2 diabetes mellitus without complications: Secondary | ICD-10-CM | POA: Diagnosis not present

## 2017-03-15 DIAGNOSIS — E782 Mixed hyperlipidemia: Secondary | ICD-10-CM | POA: Diagnosis not present

## 2017-07-19 DIAGNOSIS — E782 Mixed hyperlipidemia: Secondary | ICD-10-CM | POA: Diagnosis not present

## 2017-07-19 DIAGNOSIS — E785 Hyperlipidemia, unspecified: Secondary | ICD-10-CM | POA: Diagnosis not present

## 2017-07-19 DIAGNOSIS — E1100 Type 2 diabetes mellitus with hyperosmolarity without nonketotic hyperglycemic-hyperosmolar coma (NKHHC): Secondary | ICD-10-CM | POA: Diagnosis not present

## 2017-07-19 DIAGNOSIS — I10 Essential (primary) hypertension: Secondary | ICD-10-CM | POA: Diagnosis not present

## 2017-07-19 DIAGNOSIS — E119 Type 2 diabetes mellitus without complications: Secondary | ICD-10-CM | POA: Diagnosis not present

## 2017-11-08 DIAGNOSIS — E1100 Type 2 diabetes mellitus with hyperosmolarity without nonketotic hyperglycemic-hyperosmolar coma (NKHHC): Secondary | ICD-10-CM | POA: Diagnosis not present

## 2017-11-08 DIAGNOSIS — E785 Hyperlipidemia, unspecified: Secondary | ICD-10-CM | POA: Diagnosis not present

## 2017-11-08 DIAGNOSIS — I10 Essential (primary) hypertension: Secondary | ICD-10-CM | POA: Diagnosis not present

## 2017-11-17 DIAGNOSIS — I10 Essential (primary) hypertension: Secondary | ICD-10-CM | POA: Diagnosis not present

## 2017-11-17 DIAGNOSIS — E119 Type 2 diabetes mellitus without complications: Secondary | ICD-10-CM | POA: Diagnosis not present

## 2017-11-17 DIAGNOSIS — E785 Hyperlipidemia, unspecified: Secondary | ICD-10-CM | POA: Diagnosis not present

## 2017-12-18 IMAGING — CR DG CHEST 1V PORT
1 series · 1 of 1 positions shown · non-contrast
Comparison: 03/13/2012.

CLINICAL DATA: DKA.

EXAM:
PORTABLE CHEST 1 VIEW

[AP]
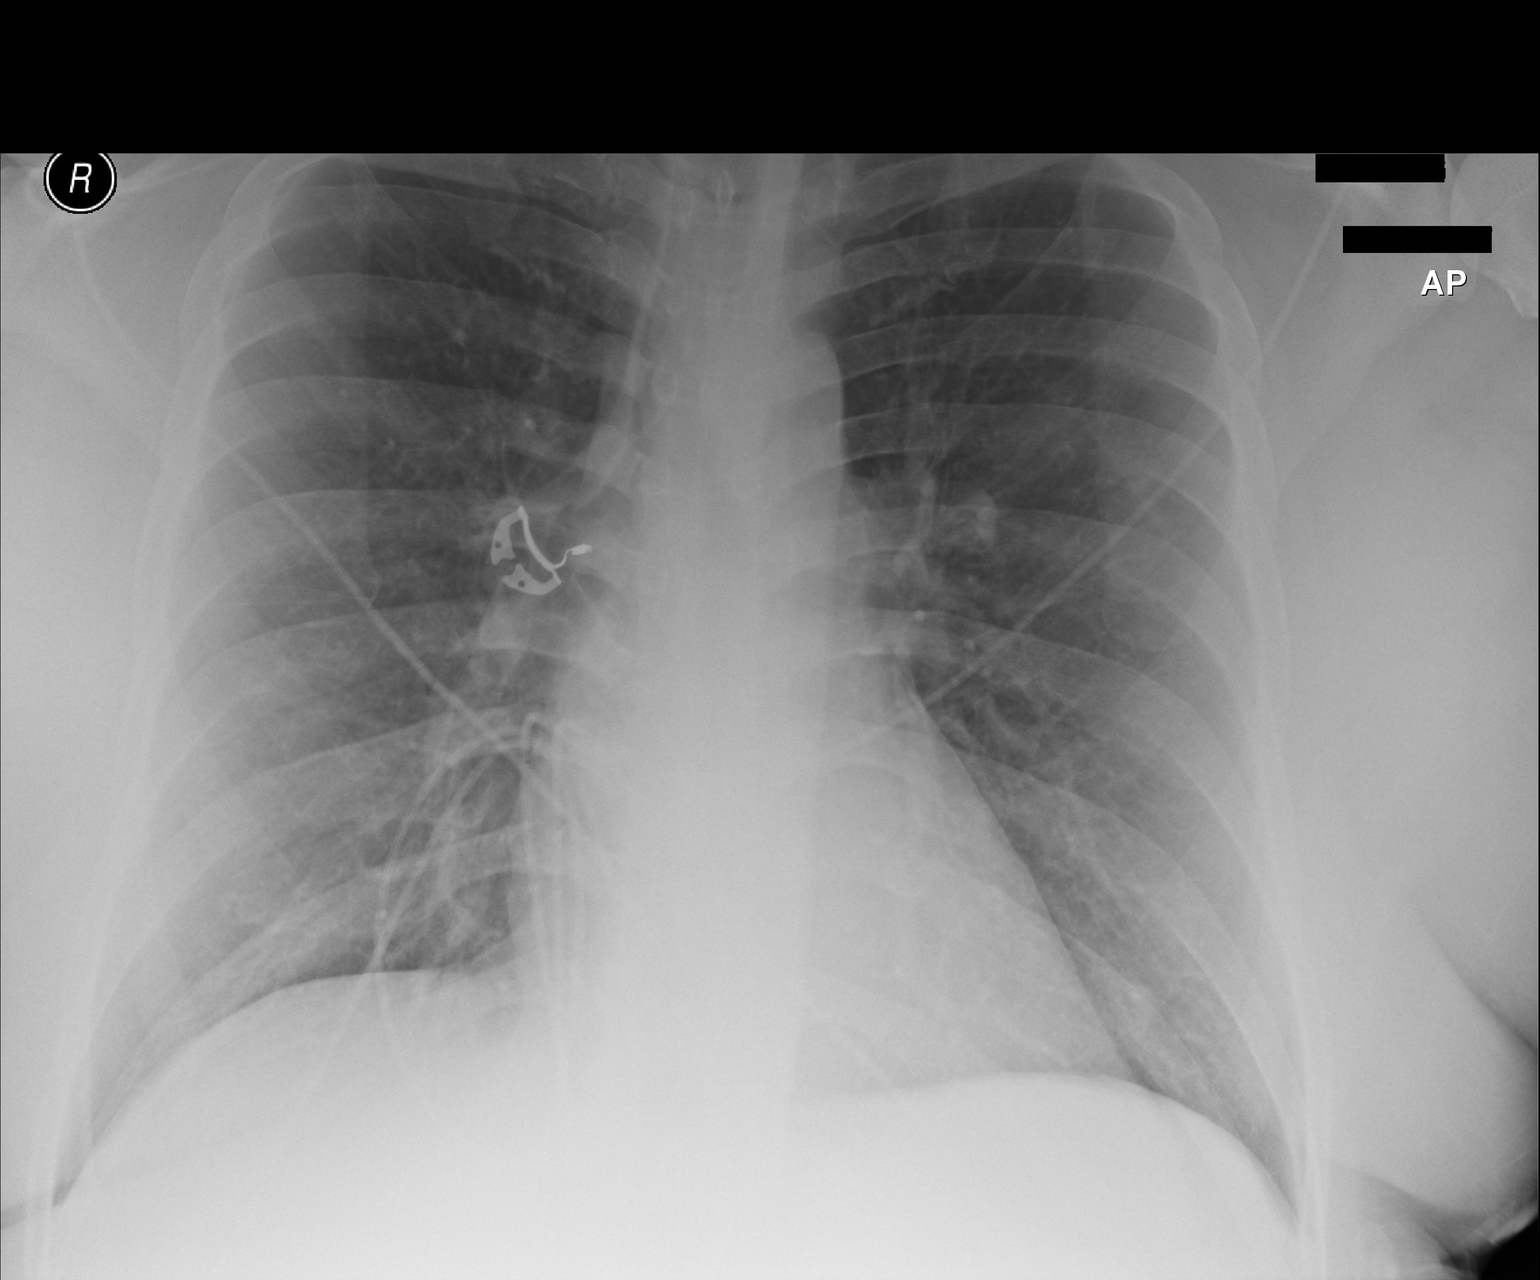

[1 of 1 positions shown; findings below may reference images not displayed]

FINDINGS: The heart size and mediastinal contours are within normal limits.
Both lungs are clear. The visualized skeletal structures are
unremarkable.
IMPRESSION: No active disease.

## 2018-01-12 DIAGNOSIS — E785 Hyperlipidemia, unspecified: Secondary | ICD-10-CM | POA: Diagnosis not present

## 2018-01-12 DIAGNOSIS — I10 Essential (primary) hypertension: Secondary | ICD-10-CM | POA: Diagnosis not present

## 2018-01-12 DIAGNOSIS — E119 Type 2 diabetes mellitus without complications: Secondary | ICD-10-CM | POA: Diagnosis not present

## 2018-01-16 DIAGNOSIS — E119 Type 2 diabetes mellitus without complications: Secondary | ICD-10-CM | POA: Diagnosis not present

## 2018-01-16 DIAGNOSIS — I10 Essential (primary) hypertension: Secondary | ICD-10-CM | POA: Diagnosis not present

## 2018-04-12 DIAGNOSIS — E785 Hyperlipidemia, unspecified: Secondary | ICD-10-CM | POA: Diagnosis not present

## 2018-04-12 DIAGNOSIS — E119 Type 2 diabetes mellitus without complications: Secondary | ICD-10-CM | POA: Diagnosis not present

## 2018-04-18 DIAGNOSIS — I10 Essential (primary) hypertension: Secondary | ICD-10-CM | POA: Diagnosis not present

## 2018-04-18 DIAGNOSIS — E1100 Type 2 diabetes mellitus with hyperosmolarity without nonketotic hyperglycemic-hyperosmolar coma (NKHHC): Secondary | ICD-10-CM | POA: Diagnosis not present

## 2018-04-30 DIAGNOSIS — E119 Type 2 diabetes mellitus without complications: Secondary | ICD-10-CM | POA: Diagnosis not present

## 2018-04-30 DIAGNOSIS — E1100 Type 2 diabetes mellitus with hyperosmolarity without nonketotic hyperglycemic-hyperosmolar coma (NKHHC): Secondary | ICD-10-CM | POA: Diagnosis not present

## 2018-05-02 DIAGNOSIS — I10 Essential (primary) hypertension: Secondary | ICD-10-CM | POA: Diagnosis not present

## 2018-05-02 DIAGNOSIS — Z23 Encounter for immunization: Secondary | ICD-10-CM | POA: Diagnosis not present

## 2018-05-02 DIAGNOSIS — E1100 Type 2 diabetes mellitus with hyperosmolarity without nonketotic hyperglycemic-hyperosmolar coma (NKHHC): Secondary | ICD-10-CM | POA: Diagnosis not present

## 2018-05-02 DIAGNOSIS — E782 Mixed hyperlipidemia: Secondary | ICD-10-CM | POA: Diagnosis not present

## 2018-07-02 DIAGNOSIS — Z0100 Encounter for examination of eyes and vision without abnormal findings: Secondary | ICD-10-CM | POA: Diagnosis not present

## 2018-08-01 DIAGNOSIS — E119 Type 2 diabetes mellitus without complications: Secondary | ICD-10-CM | POA: Diagnosis not present

## 2018-08-01 DIAGNOSIS — I10 Essential (primary) hypertension: Secondary | ICD-10-CM | POA: Diagnosis not present

## 2018-08-01 DIAGNOSIS — E782 Mixed hyperlipidemia: Secondary | ICD-10-CM | POA: Diagnosis not present

## 2018-08-03 DIAGNOSIS — E1100 Type 2 diabetes mellitus with hyperosmolarity without nonketotic hyperglycemic-hyperosmolar coma (NKHHC): Secondary | ICD-10-CM | POA: Diagnosis not present

## 2018-08-03 DIAGNOSIS — I10 Essential (primary) hypertension: Secondary | ICD-10-CM | POA: Diagnosis not present

## 2018-11-05 DIAGNOSIS — E785 Hyperlipidemia, unspecified: Secondary | ICD-10-CM | POA: Diagnosis not present

## 2018-11-05 DIAGNOSIS — E119 Type 2 diabetes mellitus without complications: Secondary | ICD-10-CM | POA: Diagnosis not present

## 2018-11-05 DIAGNOSIS — E782 Mixed hyperlipidemia: Secondary | ICD-10-CM | POA: Diagnosis not present

## 2018-11-07 DIAGNOSIS — E6609 Other obesity due to excess calories: Secondary | ICD-10-CM | POA: Diagnosis not present

## 2018-11-07 DIAGNOSIS — I10 Essential (primary) hypertension: Secondary | ICD-10-CM | POA: Diagnosis not present

## 2018-11-07 DIAGNOSIS — E1169 Type 2 diabetes mellitus with other specified complication: Secondary | ICD-10-CM | POA: Diagnosis not present

## 2018-11-14 ENCOUNTER — Emergency Department (HOSPITAL_COMMUNITY)
Admission: EM | Admit: 2018-11-14 | Discharge: 2018-11-14 | Disposition: A | Discharge status: Home / Self Care | Payer: 59 | Attending: Emergency Medicine | Admitting: Emergency Medicine

## 2018-11-14 ENCOUNTER — Other Ambulatory Visit: Payer: Self-pay

## 2018-11-14 DIAGNOSIS — E119 Type 2 diabetes mellitus without complications: Secondary | ICD-10-CM | POA: Insufficient documentation

## 2018-11-14 DIAGNOSIS — R202 Paresthesia of skin: Secondary | ICD-10-CM | POA: Insufficient documentation

## 2018-11-14 DIAGNOSIS — Z7984 Long term (current) use of oral hypoglycemic drugs: Secondary | ICD-10-CM | POA: Diagnosis not present

## 2018-11-14 DIAGNOSIS — D1722 Benign lipomatous neoplasm of skin and subcutaneous tissue of left arm: Secondary | ICD-10-CM | POA: Diagnosis not present

## 2018-11-14 NOTE — ED Provider Notes (Signed)
[facility] EMERGENCY DEPARTMENT Provider Note   CSN: [id number] Arrival date & time: 11/14/18  1050    History   Chief Complaint No chief complaint on file.   HPI Timothy Sutton is a 43 y.o. male.     HPI   43 year old male presents today with complaints of numbness and tingling in his left fingers.  Patient notes that symptoms started yesterday with pain in his left forearm and tingling and numbness in the fingertips.  He notes this includes all fingers and is only on the distal aspect.  He denies any numbness on the proximal hand wrist or remainder of upper extremity.  He denies any trauma, denies any neck pain or shoulder pain.  No associated weakness, no other neurological deficits.  He denies any chest pain.  Patient notes a nodule on his left upper arm he is uncertain how long this is been there.  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  Past Medical History:  Diagnosis Date  . Diabetes mellitus without complication [facility])     Patient Active Problem List   Diagnosis Date Noted  . Dehydration   . DKA (diabetic ketoacidoses) ([place])   . HLD (hyperlipidemia)   . Hyperglycemia 12/03/2015  . DKA, type 2 ([place]) 12/03/2015  . ARF (acute renal failure) ([place]) 12/03/2015  . Hyponatremia 12/03/2015  . AKI (acute kidney injury) [facility])     Past Surgical History:  Procedure Laterality Date  . HERNIA REPAIR          Home Medications    Prior to Admission medications   Medication Sig Start Date End Date Taking? Authorizing Provider  ibuprofen (ADVIL,MOTRIN) 400 MG tablet Take 1 tablet (400 mg total) by mouth every 12 (twelve) hours as needed for moderate pain. 12/05/15   Barton Dubois, MD  Insulin Glargine (LANTUS) 100 UNIT/ML Solostar Pen Inject 25 Units into the skin at bedtime. 9 pm 12/05/15   Barton Dubois, MD  Insulin Pen Needle 31G X 6 MM MISC Use daily to inject insulin as instructed 12/05/15   Barton Dubois, MD  Lancets 30G MISC Use  to check blood sugar three times daily (fasting in am; around lunch and at bedtime) 12/05/15   Barton Dubois, MD  metFORMIN (GLUCOPHAGE) 500 MG tablet Take 1 tablet (500 mg total) by mouth 2 (two) times daily with a meal. 12/05/15   Barton Dubois, MD  Multiple Vitamin (MULTIVITAMIN) LIQD Take 5 mLs by mouth daily.    [provider]  omega-3 acid ethyl esters (LOVAZA) 1 g capsule Take 2 capsules (2 g total) by mouth daily. 12/05/15   Barton Dubois, MD  simvastatin (ZOCOR) 20 MG tablet Take 1 tablet (20 mg total) by mouth daily at 6 PM. 12/05/15   Barton Dubois, MD    Family History Family History  Problem Relation Age of Onset  . Leukemia Father   . Diabetes Father   . Diabetes Other     Social History Social History   Tobacco Use  . Smoking status: Current Every Day Smoker    Types: Cigarettes  Substance Use Topics  . Alcohol use: Yes    Comment: occ  . Drug use: No     Allergies   Tomato   Review of Systems Review of Systems  All other systems reviewed and are negative.    Physical Exam Updated Vital Signs BP (!) 146/96 (BP Location: Right Arm)   Pulse 86   Temp 98.4 F (  36.9 C) (Oral)   Resp 15   Ht 5\' 9"  (1.753 m)   Wt 120.2 kg   SpO2 96%   BMI 39.13 kg/m   Physical Exam Vitals signs and nursing note reviewed.  Constitutional:      Appearance: He is well-developed.  HENT:     Head: Normocephalic and atraumatic.  Eyes:     General: No scleral icterus.       Right eye: No discharge.        Left eye: No discharge.     Conjunctiva/sclera: Conjunctivae normal.     Pupils: Pupils are equal, round, and reactive to light.  Neck:     Musculoskeletal: Normal range of motion.     Vascular: No JVD.     Trachea: No tracheal deviation.  Pulmonary:     Effort: Pulmonary effort is normal.     Breath sounds: No stridor.  Musculoskeletal:     Comments: Left arm is atraumatic with no swelling or edema, no cervical thoracic spinal tenderness, no pain with  axial compression of the cervical spine, full active range of motion at the shoulder elbow and wrist, no pain at the elbow or shoulder, pain with palpation of the left forearm-strength that wrist and hands 5 out of 5 in all range of motion, sensation intact to the distal fingertips where this is decreased, cap refill less than 2 seconds, radial pulse 2+-he notes symptoms improved with phalen's test  0.5 cm round firm nodule left upper extremity, this is mobile- no overlying erythema  Neurological:     Mental Status: He is alert and oriented to person, place, and time.     Cranial Nerves: No cranial nerve deficit.     Sensory: Sensory deficit present.     Motor: No weakness.     Coordination: Coordination normal.  Psychiatric:        Behavior: Behavior normal.        Thought Content: Thought content normal.        Judgment: Judgment normal.      ED Treatments / Results  Labs (all labs ordered are listed, but only abnormal results are displayed) Labs Reviewed - No data to display  EKG None  Radiology No results found.  Procedures Procedures (including critical care time)  Medications Ordered in ED Medications - No data to display   Initial Impression / Assessment and Plan / ED Course  I have reviewed the triage vital signs and the nursing notes.  Pertinent labs & imaging results that were available during my care of the patient were reviewed by me and considered in my medical decision making (see chart for details).        43 year old male presents today with paresthesias in his left hand.  Patient has no acute weaknesses, no signs of central etiology.  Patient does have soreness along the forearm question overuse injury of the left extremity.  He has no chest pain or any other concerning signs or symptoms to indicate cardiac etiology.  Patient will use ibuprofen, rest, return immediately if he develops any new or worsening signs or symptoms.  Patient also has a nodule which  is likely fatty tissue.  He will follow-up with his primary care for reevaluation of this.  He is given strict return precautions, he verbalized understanding and agreement to today's plan.  Final Clinical Impressions(s) / ED Diagnoses   Final diagnoses:  Paresthesia  Lipoma of left upper extremity    ED Discharge Orders  None       Okey Regal, Hershal Coria 11/14/18 1119    Hayden Rasmussen, MD 11/15/18 [phone]

## 2018-11-14 NOTE — ED Triage Notes (Signed)
Starting last PM soreness and numbness and tingling to L arm.  Denies any CP, SOB.  No pain noted to arm.  ROM, CMS intact.  No known injury

## 2018-11-14 NOTE — Discharge Instructions (Addendum)
Please read attached information. If you experience any new or worsening signs or symptoms please return to the emergency room for evaluation. Please follow-up with your primary care provider or specialist as discussed.  °

## 2018-11-14 NOTE — ED Triage Notes (Signed)
Pt in with c/o L arm pain and numbness since 9pm last night. Denies any other neuro symptoms. States he feels a "knot" underneath L armpit, tingling and pain begins there. MAE's equally, speech clear

## 2019-09-19 ENCOUNTER — Ambulatory Visit: Payer: 59 | Attending: Internal Medicine

## 2019-09-19 DIAGNOSIS — Z23 Encounter for immunization: Secondary | ICD-10-CM

## 2019-09-19 NOTE — Progress Notes (Signed)
   Covid-19 Vaccination Clinic  Name:  DARLIN GEER    MRN: [id number] DOB: [birth year]  09/19/2019  Mr. Franek was observed post Covid-19 immunization for 15 minutes without incident. He was provided with Vaccine Information Sheet and instruction to access the V-Safe system.   Mr. Kirley was instructed to call 911 with any severe reactions post vaccine: Marland Kitchen Difficulty breathing  . Swelling of face and throat  . A fast heartbeat  . A bad rash all over body  . Dizziness and weakness   Immunizations Administered    Name Date Dose VIS Date Route   Pfizer COVID-19 Vaccine 09/19/2019  8:13 AM 0.3 mL 06/14/2019 Intramuscular   Manufacturer: [place]   Lot: [id number]   [place]: [id number]

## 2019-10-14 ENCOUNTER — Ambulatory Visit: Payer: 59 | Attending: Internal Medicine

## 2019-10-14 DIAGNOSIS — Z23 Encounter for immunization: Secondary | ICD-10-CM

## 2019-10-14 NOTE — Progress Notes (Signed)
   Covid-19 Vaccination Clinic  Name:  MARTINE SZALAY    MRN: [id number] DOB: 11/09/1975  10/14/2019  Mr. Arcand was observed post Covid-19 immunization for 15 minutes without incident. He was provided with Vaccine Information Sheet and instruction to access the V-Safe system.   Mr. Gemmell was instructed to call 911 with any severe reactions post vaccine: Marland Kitchen Difficulty breathing  . Swelling of face and throat  . A fast heartbeat  . A bad rash all over body  . Dizziness and weakness   Immunizations Administered    Name Date Dose VIS Date Route   Pfizer COVID-19 Vaccine 10/14/2019  8:12 AM 0.3 mL 06/14/2019 Intramuscular   Manufacturer: [place]   Lot: [id number]   [place]: [id number]

## 2022-03-16 ENCOUNTER — Other Ambulatory Visit: Payer: Self-pay

## 2022-03-16 DIAGNOSIS — E7801 Familial hypercholesterolemia: Secondary | ICD-10-CM

## 2022-03-22 ENCOUNTER — Telehealth: Payer: Self-pay | Admitting: Pharmacy Technician

## 2022-03-22 NOTE — Telephone Encounter (Signed)
Auth Submission: denied Payer: [employer] Medication & CPT/J Code(s) submitted: Leqvio (Inclisiran) [phone] Route of submission (phone, fax, portal): PORTAL Phone # Fax # Auth type: Buy/Bill Units/visits requested: X3 DOSES Reference number: [id number] Approval from:  to  at [facility] [place]   Denied due to non supporting clinical information: (must be hereditary). Spoke with Sherri @ Dr Criss Rosales office and she will call and f/u with peer to peer/appeal. [facility]: [phone] Fax: [phone]

## 2022-04-25 NOTE — Telephone Encounter (Signed)
2nd f/u: Timothy Sutton has been denied.  Left message with [facility] @ Dr. Criss Rosales office Phone: [phone] Awaiting response.

## 2022-06-24 ENCOUNTER — Encounter: Payer: Self-pay | Admitting: Pulmonary Disease

## 2022-06-24 ENCOUNTER — Other Ambulatory Visit (HOSPITAL_BASED_OUTPATIENT_CLINIC_OR_DEPARTMENT_OTHER): Payer: Self-pay

## 2022-06-24 MED ORDER — SEMAGLUTIDE-WEIGHT MANAGEMENT 1.7 MG/0.75ML ~~LOC~~ SOAJ
1.7000 mg | SUBCUTANEOUS | 1 refills | Status: AC
  Filled 2022-06-24: qty 3, 28d supply, fill #0

## 2022-07-20 ENCOUNTER — Other Ambulatory Visit (HOSPITAL_BASED_OUTPATIENT_CLINIC_OR_DEPARTMENT_OTHER): Payer: Self-pay

## 2022-07-30 ENCOUNTER — Other Ambulatory Visit (HOSPITAL_BASED_OUTPATIENT_CLINIC_OR_DEPARTMENT_OTHER): Payer: Self-pay

## 2022-08-05 ENCOUNTER — Other Ambulatory Visit (HOSPITAL_BASED_OUTPATIENT_CLINIC_OR_DEPARTMENT_OTHER): Payer: Self-pay

## 2022-08-14 ENCOUNTER — Other Ambulatory Visit (HOSPITAL_BASED_OUTPATIENT_CLINIC_OR_DEPARTMENT_OTHER): Payer: Self-pay

## 2022-08-24 ENCOUNTER — Other Ambulatory Visit (HOSPITAL_BASED_OUTPATIENT_CLINIC_OR_DEPARTMENT_OTHER): Payer: Self-pay

## 2022-08-25 ENCOUNTER — Other Ambulatory Visit (HOSPITAL_BASED_OUTPATIENT_CLINIC_OR_DEPARTMENT_OTHER): Payer: Self-pay

## 2022-08-29 ENCOUNTER — Other Ambulatory Visit (HOSPITAL_BASED_OUTPATIENT_CLINIC_OR_DEPARTMENT_OTHER): Payer: Self-pay

## 2022-09-04 ENCOUNTER — Other Ambulatory Visit (HOSPITAL_BASED_OUTPATIENT_CLINIC_OR_DEPARTMENT_OTHER): Payer: Self-pay

## 2022-09-16 ENCOUNTER — Encounter: Payer: Self-pay | Admitting: Pulmonary Disease

## 2022-09-25 ENCOUNTER — Encounter (HOSPITAL_COMMUNITY): Payer: Self-pay | Admitting: Emergency Medicine

## 2022-09-25 ENCOUNTER — Other Ambulatory Visit: Payer: Self-pay

## 2022-09-25 ENCOUNTER — Emergency Department (HOSPITAL_COMMUNITY)
Admission: EM | Admit: 2022-09-25 | Discharge: 2022-09-25 | Disposition: A | Discharge status: Home / Self Care | Payer: 59 | Attending: Emergency Medicine | Admitting: Emergency Medicine

## 2022-09-25 ENCOUNTER — Emergency Department (HOSPITAL_COMMUNITY): Payer: 59

## 2022-09-25 DIAGNOSIS — R519 Headache, unspecified: Secondary | ICD-10-CM | POA: Insufficient documentation

## 2022-09-25 DIAGNOSIS — E860 Dehydration: Secondary | ICD-10-CM | POA: Diagnosis not present

## 2022-09-25 DIAGNOSIS — F17210 Nicotine dependence, cigarettes, uncomplicated: Secondary | ICD-10-CM | POA: Diagnosis not present

## 2022-09-25 DIAGNOSIS — E1165 Type 2 diabetes mellitus with hyperglycemia: Secondary | ICD-10-CM | POA: Diagnosis not present

## 2022-09-25 DIAGNOSIS — Z7984 Long term (current) use of oral hypoglycemic drugs: Secondary | ICD-10-CM | POA: Insufficient documentation

## 2022-09-25 DIAGNOSIS — R739 Hyperglycemia, unspecified: Secondary | ICD-10-CM

## 2022-09-25 DIAGNOSIS — Z794 Long term (current) use of insulin: Secondary | ICD-10-CM | POA: Diagnosis not present

## 2022-09-25 DIAGNOSIS — R5383 Other fatigue: Secondary | ICD-10-CM | POA: Diagnosis present

## 2022-09-25 LAB — COMPREHENSIVE METABOLIC PANEL
ALT: 25 U/L (ref 0–44)
AST: 26 U/L (ref 15–41)
Albumin: 4.1 g/dL (ref 3.5–5.0)
Alkaline Phosphatase: 61 U/L (ref 38–126)
Anion gap: 14 (ref 5–15)
BUN: 7 mg/dL (ref 6–20)
CO2: 23 mmol/L (ref 22–32)
Calcium: 9 mg/dL (ref 8.9–10.3)
Chloride: 96 mmol/L — ABNORMAL LOW (ref 98–111)
Creatinine, Ser: 0.8 mg/dL (ref 0.61–1.24)
GFR, Estimated: >60 mL/min (ref >60)
Glucose, Bld: 277 mg/dL — ABNORMAL HIGH (ref 70–99)
Potassium: 3.8 mmol/L (ref 3.5–5.1)
Sodium: 133 mmol/L — ABNORMAL LOW (ref 135–145)
Total Bilirubin: 0.8 mg/dL (ref 0.3–1.2)
Total Protein: 7.1 g/dL (ref 6.5–8.1)

## 2022-09-25 LAB — URINALYSIS, ROUTINE W REFLEX MICROSCOPIC
Bacteria, UA: NONE SEEN
Bilirubin Urine: NEGATIVE
Glucose, UA: >=500 mg/dL — AB
Hgb urine dipstick: NEGATIVE
Ketones, ur: NEGATIVE mg/dL
Leukocytes,Ua: NEGATIVE
Nitrite: NEGATIVE
Protein, ur: NEGATIVE mg/dL
Specific Gravity, Urine: 1.013 (ref 1.005–1.030)
pH: 6 (ref 5.0–8.0)

## 2022-09-25 LAB — CBC WITH DIFFERENTIAL/PLATELET
Abs Immature Granulocytes: 0.02 10*3/uL (ref 0.00–0.07)
Basophils Absolute: 0.1 10*3/uL (ref 0.0–0.1)
Basophils Relative: 1 %
Eosinophils Absolute: 0.1 10*3/uL (ref 0.0–0.5)
Eosinophils Relative: 1 %
HCT: 45 % (ref 39.0–52.0)
Hemoglobin: 14.9 g/dL (ref 13.0–17.0)
Immature Granulocytes: 0 %
Lymphocytes Relative: 33 %
Lymphs Abs: 2.2 10*3/uL (ref 0.7–4.0)
MCH: 27.1 pg (ref 26.0–34.0)
MCHC: 33.1 g/dL (ref 30.0–36.0)
MCV: 81.8 fL (ref 80.0–100.0)
Monocytes Absolute: 0.5 10*3/uL (ref 0.1–1.0)
Monocytes Relative: 8 %
Neutro Abs: 3.9 10*3/uL (ref 1.7–7.7)
Neutrophils Relative %: 57 %
Platelets: 315 10*3/uL (ref 150–400)
RBC: 5.5 MIL/uL (ref 4.22–5.81)
RDW: 13.4 % (ref 11.5–15.5)
WBC: 6.8 10*3/uL (ref 4.0–10.5)
nRBC: 0 % (ref 0.0–0.2)

## 2022-09-25 LAB — CBG MONITORING, ED: Glucose-Capillary: 273 mg/dL — ABNORMAL HIGH (ref 70–99)

## 2022-09-25 MED ORDER — METOCLOPRAMIDE HCL 5 MG/ML IJ SOLN
10.0000 mg | Freq: Once | INTRAMUSCULAR | Status: AC
  Administered 2022-09-25: 10 mg via INTRAVENOUS
  Filled 2022-09-25: qty 2

## 2022-09-25 MED ORDER — METFORMIN HCL 500 MG PO TABS: 500.0000 mg | ORAL_TABLET | Freq: Two times a day (BID) | ORAL | 2 refills | Status: DC

## 2022-09-25 MED ORDER — SODIUM CHLORIDE 0.9 % IV BOLUS
1000.0000 mL | Freq: Once | INTRAVENOUS | Status: AC
  Administered 2022-09-25: 1000 mL via INTRAVENOUS

## 2022-09-25 MED ORDER — ACETAMINOPHEN 500 MG PO TABS
1000.0000 mg | ORAL_TABLET | Freq: Once | ORAL | Status: AC
  Administered 2022-09-25: 1000 mg via ORAL
  Filled 2022-09-25: qty 2

## 2022-09-25 NOTE — ED Notes (Signed)
Secondary RN reviewed discharge instructions with patient and family. They verbalized understanding and denied any further questions. PT well appearing upon discharge and reports tolerable pain. Pt ambulated with stable gait to exit. Pt endorses ride home.

## 2022-09-25 NOTE — Discharge Instructions (Addendum)
We evaluated you in the emergency department for your weakness.  Your symptoms are most likely due to high blood sugar.  Your symptoms improved with IV fluids.  Since you are only taking Ozempic, I would like you to restart metformin.  I have sent a prescription for this to your pharmacy.  Please schedule an appointment soon for your primary care doctor as they can recheck your blood sugar and make medication adjustments as necessary.  If you develop any new or worsening symptoms such as severe headaches, vomiting, unable to tolerate food, fevers or chills, worsening abdominal pain, or any other new symptoms, please return for reassessment.

## 2022-09-25 NOTE — ED Triage Notes (Signed)
Pt c/o weakness and headache x 4 days. States that today was the first time his cbg was <280, started taking ozempic x 2 weeks ago.

## 2022-09-25 NOTE — ED Provider Notes (Signed)
[facility] Provider Note  CSN: [id number] Arrival date & time: 09/25/22 [id number]  Chief Complaint(s) Weakness  HPI Timothy Sutton is a 47 y.o. male history of diabetes, hyperlipidemia presenting to the emergency department with fatigue.  Patient reports fatigue for 3 days associated with headache.  He also reports some left upper quadrant/flank pain.  No dysuria.  No fevers or chills.  Reports mild nausea, no vomiting.  No chest pain or shortness of breath.  No numbness or tingling.  Does report mild blurry vision.  No diarrhea.  He notes that his blood sugar has been elevated recently, had been off Ozempic due to insurance issues but started taking again 2 weeks ago.   Past Medical History Past Medical History:  Diagnosis Date   Diabetes mellitus without complication [facility])    Patient Active Problem List   Diagnosis Date Noted   Heterozygous familial hypercholesterolemia 03/16/2022   Dehydration    DKA (diabetic ketoacidoses)    HLD (hyperlipidemia)    Hyperglycemia 12/03/2015   DKA, type 2 ([place]) 12/03/2015   ARF (acute renal failure) ([place]) 12/03/2015   Hyponatremia 12/03/2015   AKI (acute kidney injury) ([place])    Home Medication(s) Prior to Admission medications   Medication Sig Start Date End Date Taking? Authorizing Provider  ibuprofen (ADVIL,MOTRIN) 400 MG tablet Take 1 tablet (400 mg total) by mouth every 12 (twelve) hours as needed for moderate pain. 12/05/15   Barton Dubois, MD  Insulin Glargine (LANTUS) 100 UNIT/ML Solostar Pen Inject 25 Units into the skin at bedtime. 9 pm 12/05/15   Barton Dubois, MD  Insulin Pen Needle 31G X 6 MM MISC Use daily to inject insulin as instructed 12/05/15   Barton Dubois, MD  Lancets 30G MISC Use to check blood sugar three times daily (fasting in am; around lunch and at bedtime) 12/05/15   Barton Dubois, MD  metFORMIN (GLUCOPHAGE) 500 MG tablet Take 1 tablet (500 mg total) by mouth 2 (two) times daily  with a meal. 09/25/22   Cristie Hem, MD  Multiple Vitamin (MULTIVITAMIN) LIQD Take 5 mLs by mouth daily.    [provider]  omega-3 acid ethyl esters (LOVAZA) 1 g capsule Take 2 capsules (2 g total) by mouth daily. 12/05/15   Barton Dubois, MD  Semaglutide-Weight Management 1.7 MG/0.75ML SOAJ Inject 1.7 mg into the skin once weekly. 06/24/22     simvastatin (ZOCOR) 20 MG tablet Take 1 tablet (20 mg total) by mouth daily at 6 PM. 12/05/15   Barton Dubois, MD                                                                                                                                    Past Surgical History Past Surgical History:  Procedure Laterality Date   HERNIA REPAIR     Family History Family History  Problem Relation Age of Onset  Leukemia Father    Diabetes Father    Diabetes Other     Social History Social History   Tobacco Use   Smoking status: Every Day    Types: Cigarettes  Substance Use Topics   Alcohol use: Yes    Comment: occ   Drug use: No   Allergies Tomato  Review of Systems Review of Systems  All other systems reviewed and are negative.   Physical Exam Vital Signs  I have reviewed the triage vital signs BP 132/83   Pulse 68   Temp 98.5 F (36.9 C)   Resp (!) 24   Ht 5\' 9"  (1.753 m)   Wt 127 kg   SpO2 100%   BMI 41.35 kg/m  Physical Exam Vitals and nursing note reviewed.  Constitutional:      General: He is not in acute distress.    Appearance: Normal appearance.  HENT:     Mouth/Throat:     Mouth: Mucous membranes are dry.  Eyes:     Conjunctiva/sclera: Conjunctivae normal.  Cardiovascular:     Rate and Rhythm: Normal rate and regular rhythm.  Pulmonary:     Effort: Pulmonary effort is normal. No respiratory distress.     Breath sounds: Normal breath sounds.  Abdominal:     General: Abdomen is flat.     Palpations: Abdomen is soft.     Tenderness: There is no abdominal tenderness. There is no right CVA tenderness  or left CVA tenderness.  Musculoskeletal:     Right lower leg: No edema.     Left lower leg: No edema.  Skin:    General: Skin is warm and dry.     Capillary Refill: Capillary refill takes less than 2 seconds.  Neurological:     Mental Status: He is alert and oriented to person, place, and time. Mental status is at baseline.     Comments: Cranial nerves II through XII intact, strength 5 out of 5 in the bilateral upper and lower extremities, no sensory deficit to light touch, no dysmetria on finger-nose-finger testing, ambulatory with steady gait.  Psychiatric:        Mood and Affect: Mood normal.        Behavior: Behavior normal.     ED Results and Treatments Labs (all labs ordered are listed, but only abnormal results are displayed) Labs Reviewed  COMPREHENSIVE METABOLIC PANEL - Abnormal; Notable for the following components:      Result Value   Sodium 133 (*)    Chloride 96 (*)    Glucose, Bld 277 (*)    All other components within normal limits  URINALYSIS, ROUTINE W REFLEX MICROSCOPIC - Abnormal; Notable for the following components:   Glucose, UA >=500 (*)    All other components within normal limits  CBG MONITORING, ED - Abnormal; Notable for the following components:   Glucose-Capillary 273 (*)    All other components within normal limits  CBC WITH DIFFERENTIAL/PLATELET  Radiology CT Head Wo Contrast  Result Date: 09/25/2022 CLINICAL DATA:  47 year old male with increasing headache. EXAM: CT HEAD WITHOUT CONTRAST TECHNIQUE: Contiguous axial images were obtained from the base of the skull through the vertex without intravenous contrast. RADIATION DOSE REDUCTION: This exam was performed according to the departmental dose-optimization program which includes automated exposure control, adjustment of the mA and/or kV according to patient size and/or use of  iterative reconstruction technique. COMPARISON:  None Available. FINDINGS: Brain: Normal cerebral volume. No midline shift, ventriculomegaly, mass effect, evidence of mass lesion, intracranial hemorrhage or evidence of cortically based acute infarction. Gray-white matter differentiation is within normal limits throughout the brain. Vascular: No suspicious intracranial vascular hyperdensity. Skull: Negative. Sinuses/Orbits: Tympanic cavities, Visualized paranasal sinuses and mastoids are well aerated. Other: Visualized orbits and scalp soft tissues are within normal limits. IMPRESSION: Normal noncontrast Head CT. Electronically Signed   By: Genevie Ann M.D.   On: 09/25/2022 09:55    Pertinent labs & imaging results that were available during my care of the patient were reviewed by me and considered in my medical decision making (see MDM for details).  Medications Ordered in ED Medications  sodium chloride 0.9 % bolus 1,000 mL (0 mLs Intravenous Stopped 09/25/22 0744)  sodium chloride 0.9 % bolus 1,000 mL (0 mLs Intravenous Stopped 09/25/22 1027)  acetaminophen (TYLENOL) tablet 1,000 mg (1,000 mg Oral Given 09/25/22 0845)  metoCLOPramide (REGLAN) injection 10 mg (10 mg Intravenous Given 09/25/22 [id number])                                                                                                                                     Procedures Procedures  (including critical care time)  Medical Decision Making / ED Course   MDM:  47 year old male presenting to the emergency department with weakness.  Patient overall well-appearing, vital signs reassuring.  No fever.  Neurologic exam is reassuring.  Does appear mildly dehydrated.  Suspect most likely cause is dehydration and hyperglycemia.  He received IV fluids, reports improvement in his blurry vision and his fatigue as well as his headache.  Patient denies any history of headaches, his neurologic exam is reassuring but will obtain CT head to rule out  intracranial process.  Low concern for [facility], not thunderclap or sudden onset.  No evidence of DKA on labs.  He also reports some left upper quadrant abdominal pain, his abdomen is soft, could be mild gastroparesis due to his hyperglycemia, he reports it radiates to his sides will also check urinalysis to evaluate for urinary infection or pyelonephritis although no discrete CVA tenderness.  Given soft abdominal exam, very low concern for acute surgical pathology such as perforation, obstruction, pancreatitis, cholecystitis, appendicitis.  Will defer abdominal imaging currently.  Will re-assess  Clinical Course as of 09/25/22 1048  Sun Sep 25, 2022  1046 Patient feels much better after IV fluids.  Labs notable for significant hyperglycemia, suggestive  of dehydration.  CT head is negative.  He is only taking Ozempic for his hyperglycemia from diabetes so we will prescribe metformin, he has taken this in the past.  Advise close follow-up with his primary care doctor in 1 week for recheck and medication adjustments as necessary. Will discharge patient to home. All questions answered. Patient comfortable with plan of discharge. Return precautions discussed with patient and specified on the after visit summary.  [WS]    Clinical Course User Index [WS] Truett Mainland, Livingston Diones, MD     Additional history obtained: -Additional history obtained from family -External records from outside source obtained and reviewed including: Chart review including previous notes, labs, imaging, consultation notes including prior ED visits for hyperglycemia, prior admit for DKA   Lab Tests: -I ordered, reviewed, and interpreted labs.   The pertinent results include:   Labs Reviewed  COMPREHENSIVE METABOLIC PANEL - Abnormal; Notable for the following components:      Result Value   Sodium 133 (*)    Chloride 96 (*)    Glucose, Bld 277 (*)    All other components within normal limits  URINALYSIS, ROUTINE W REFLEX MICROSCOPIC  - Abnormal; Notable for the following components:   Glucose, UA >=500 (*)    All other components within normal limits  CBG MONITORING, ED - Abnormal; Notable for the following components:   Glucose-Capillary 273 (*)    All other components within normal limits  CBC WITH DIFFERENTIAL/PLATELET    Notable for hyperglycemia     Imaging Studies ordered: I ordered imaging studies including CT head On my interpretation imaging demonstrates no acute process I independently visualized and interpreted imaging. I agree with the radiologist interpretation   Medicines ordered and prescription drug management: Meds ordered this encounter  Medications   sodium chloride 0.9 % bolus 1,000 mL   sodium chloride 0.9 % bolus 1,000 mL   acetaminophen (TYLENOL) tablet 1,000 mg   metoCLOPramide (REGLAN) injection 10 mg   metFORMIN (GLUCOPHAGE) 500 MG tablet    Sig: Take 1 tablet (500 mg total) by mouth 2 (two) times daily with a meal.    Dispense:  60 tablet    Refill:  2    -I have reviewed the patients home medicines and have made adjustments as needed  Cardiac Monitoring: The patient was maintained on a cardiac monitor.  I personally viewed and interpreted the cardiac monitored which showed an underlying rhythm of: NSR  Social Determinants of Health:  Diagnosis or treatment significantly limited by social determinants of health: obesity   Reevaluation: After the interventions noted above, I reevaluated the patient and found that their symptoms have improved  Co morbidities that complicate the patient evaluation  Past Medical History:  Diagnosis Date   Diabetes mellitus without complication ([place])       Dispostion: Disposition decision including need for hospitalization was considered, and patient discharged from emergency department.    Final Clinical Impression(s) / ED Diagnoses Final diagnoses:  Dehydration  Hyperglycemia     This chart was dictated using voice  recognition software.  Despite best efforts to proofread,  errors can occur which can change the documentation meaning.    Cristie Hem, MD 09/25/22 1048

## 2022-09-30 ENCOUNTER — Other Ambulatory Visit (HOSPITAL_BASED_OUTPATIENT_CLINIC_OR_DEPARTMENT_OTHER): Payer: Self-pay

## 2022-10-09 ENCOUNTER — Other Ambulatory Visit (HOSPITAL_BASED_OUTPATIENT_CLINIC_OR_DEPARTMENT_OTHER): Payer: Self-pay

## 2023-03-16 ENCOUNTER — Ambulatory Visit: Payer: Self-pay | Admitting: Surgery

## 2023-03-27 NOTE — Progress Notes (Signed)
COVID Vaccine Completed: yes  Date of COVID positive in last 90 days:  PCP - Renaye Rakers, MD Cardiologist -   Chest x-ray -  EKG -  Stress Test -  ECHO -  Cardiac Cath -  Pacemaker/ICD device last checked: Spinal Cord Stimulator:  Bowel Prep -   Sleep Study -  CPAP -   Fasting Blood Sugar -  Checks Blood Sugar _____ times a day  Last dose of GLP1 agonist-   GLP1 instructions:     Last dose of SGLT-2 inhibitors-  N/A SGLT-2 instructions: N/A   Blood Thinner Instructions:  Time Aspirin Instructions: Last Dose:  Activity level:  Can go up a flight of stairs and perform activities of daily living without stopping and without symptoms of chest pain or shortness of breath.  Able to exercise without symptoms  Unable to go up a flight of stairs without symptoms of     Anesthesia review:   Patient denies shortness of breath, fever, cough and chest pain at PAT appointment  Patient verbalized understanding of instructions that were given to them at the PAT appointment. Patient was also instructed that they will need to review over the PAT instructions again at home before surgery.

## 2023-03-27 NOTE — Patient Instructions (Signed)
SURGICAL WAITING ROOM VISITATION  Patients having surgery or a procedure may have no more than 2 support people in the waiting area - these visitors may rotate.    Children under the age of 87 must have an adult with them who is not the patient.  Due to an increase in RSV and influenza rates and associated hospitalizations, children ages 60 and under may not visit patients in [facility] hospitals.  If the patient needs to stay at the hospital during part of their recovery, the visitor guidelines for inpatient rooms apply. Pre-op nurse will coordinate an appropriate time for 1 support person to accompany patient in pre-op.  This support person may not rotate.    Please refer to the [facility] website for the visitor guidelines for Inpatients (after your surgery is over and you are in a regular room).    Your procedure is scheduled on: 04/10/23   Report to [facility] Main Entrance    Report to admitting at 8:45 AM   Call this number if you have problems the morning of surgery [phone]   Do not eat food :After Midnight.   After Midnight you may have the following liquids until 8:00 AM DAY OF SURGERY  Water Non-Citrus Juices (without pulp, NO RED-Apple, White grape, White cranberry) Black Coffee (NO MILK/CREAM OR CREAMERS, sugar ok)  Clear Tea (NO MILK/CREAM OR CREAMERS, sugar ok) regular and decaf                             Plain Jell-O (NO RED)                                           Fruit ices (not with fruit pulp, NO RED)                                     Popsicles (NO RED)                                                               Sports drinks like Gatorade (NO RED)                      If you have questions, please contact your surgeon's office.   FOLLOW BOWEL PREP AND ANY ADDITIONAL PRE OP INSTRUCTIONS YOU RECEIVED FROM YOUR SURGEON'S OFFICE!!!     Oral Hygiene is also important to reduce your risk of infection.                                     Remember - BRUSH YOUR TEETH THE MORNING OF SURGERY WITH YOUR REGULAR TOOTHPASTE  DENTURES WILL BE REMOVED PRIOR TO SURGERY PLEASE DO NOT APPLY "Poly grip" OR ADHESIVES!!!   Do NOT smoke after Midnight   Stop all vitamins and herbal supplements 7 days before surgery.   Take these medicines the morning of surgery with A SIP OF WATER: None   DO NOT TAKE ANY ORAL  DIABETIC MEDICATIONS DAY OF YOUR SURGERY  How to Manage Your Diabetes Before and After Surgery  Why is it important to control my blood sugar before and after surgery? Improving blood sugar levels before and after surgery helps healing and can limit problems. A way of improving blood sugar control is eating a healthy diet by:  Eating less sugar and carbohydrates  Increasing activity/exercise  Talking with your doctor about reaching your blood sugar goals High blood sugars (greater than 180 mg/dL) can raise your risk of infections and slow your recovery, so you will need to focus on controlling your diabetes during the weeks before surgery. Make sure that the doctor who takes care of your diabetes knows about your planned surgery including the date and location.  How do I manage my blood sugar before surgery? Check your blood sugar at least 4 times a day, starting 2 days before surgery, to make sure that the level is not too high or low. Check your blood sugar the morning of your surgery when you wake up and every 2 hours until you get to the Short Stay unit. If your blood sugar is less than 70 mg/dL, you will need to treat for low blood sugar: Do not take insulin. Treat a low blood sugar (less than 70 mg/dL) with  cup of clear juice (cranberry or apple), 4 glucose tablets, OR glucose gel. Recheck blood sugar in 15 minutes after treatment (to make sure it is greater than 70 mg/dL). If your blood sugar is not greater than 70 mg/dL on recheck, call [phone] for further instructions. Report your blood sugar to the short stay  nurse when you get to Short Stay.  If you are admitted to the hospital after surgery: Your blood sugar will be checked by the staff and you will probably be given insulin after surgery (instead of oral diabetes medicines) to make sure you have good blood sugar levels. The goal for blood sugar control after surgery is 80-180 mg/dL.   WHAT DO I DO ABOUT MY DIABETES MEDICATION?  Do not take oral diabetes medicines (pills) the morning of surgery.  THE DAY BEFORE SURGERY, take     units of       insulin.      THE MORNING OF SURGERY, take   units of         insulin.  DO NOT TAKE THE FOLLOWING 7 DAYS PRIOR TO SURGERY: Ozempic, Wegovy, Rybelsus (Semaglutide), Byetta (exenatide), Bydureon (exenatide ER), Victoza, Saxenda (liraglutide), or Trulicity (dulaglutide) Mounjaro (Tirzepatide) Adlyxin (Lixisenatide), Polyethylene Glycol Loxenatide.  Reviewed and Endorsed by [facility] Patient Education Committee, August 2015  Bring CPAP mask and tubing day of surgery.                              You may not have any metal on your body including jewelry, and body piercing             Do not wear lotions, powders, cologne, or deodorant              Men may shave face and neck.   Do not bring valuables to the hospital. [facility] IS NOT             RESPONSIBLE   FOR VALUABLES.   Contacts, glasses, dentures or bridgework may not be worn into surgery.  DO NOT BRING YOUR HOME MEDICATIONS TO THE HOSPITAL. PHARMACY WILL DISPENSE MEDICATIONS LISTED ON  YOUR MEDICATION LIST TO YOU DURING YOUR ADMISSION IN THE HOSPITAL!    Patients discharged on the day of surgery will not be allowed to drive home.  Someone NEEDS to stay with you for the first 24 hours after anesthesia.   Special Instructions: Bring a copy of your healthcare power of attorney and living will documents the day of surgery if you haven't scanned them before.              Please read over the following fact sheets you were given: IF YOU HAVE  QUESTIONS ABOUT YOUR PRE-OP INSTRUCTIONS PLEASE CALL [phone] [address]ras    If you received a COVID test during your pre-op visit  it is requested that you wear a mask when out in public, stay away from anyone that may not be feeling well and notify your surgeon if you develop symptoms. If you test positive for Covid or have been in contact with anyone that has tested positive in the last 10 days please notify you surgeon.    [facility] - Preparing for Surgery Before surgery, you can play an important role.  Because skin is not sterile, your skin needs to be as free of germs as possible.  You can reduce the number of germs on your skin by washing with CHG (chlorahexidine gluconate) soap before surgery.  CHG is an antiseptic cleaner which kills germs and bonds with the skin to continue killing germs even after washing. Please DO NOT use if you have an allergy to CHG or antibacterial soaps.  If your skin becomes reddened/irritated stop using the CHG and inform your nurse when you arrive at Short Stay. Do not shave (including legs and underarms) for at least 48 hours prior to the first CHG shower.  You may shave your face/neck.  Please follow these instructions carefully:  1.  Shower with CHG Soap the night before surgery and the  morning of surgery.  2.  If you choose to wash your hair, wash your hair first as usual with your normal  shampoo.  3.  After you shampoo, rinse your hair and body thoroughly to remove the shampoo.                             4.  Use CHG as you would any other liquid soap.  You can apply chg directly to the skin and wash.  Gently with a scrungie or clean washcloth.  5.  Apply the CHG Soap to your body ONLY FROM THE NECK DOWN.   Do   not use on face/ open                           Wound or open sores. Avoid contact with eyes, ears mouth and   genitals (private parts).                       Wash face,  Genitals (private parts) with your normal soap.             6.  Wash  thoroughly, paying special attention to the area where your    surgery  will be performed.  7.  Thoroughly rinse your body with warm water from the neck down.  8.  DO NOT shower/wash with your normal soap after using and rinsing off the CHG Soap.  9.  Pat yourself dry with a clean towel.            10.  Wear clean pajamas.            11.  Place clean sheets on your bed the night of your first shower and do not  sleep with pets. Day of Surgery : Do not apply any lotions/deodorants the morning of surgery.  Please wear clean clothes to the hospital/surgery center.  FAILURE TO FOLLOW THESE INSTRUCTIONS MAY RESULT IN THE CANCELLATION OF YOUR SURGERY  PATIENT SIGNATURE_________________________________  NURSE SIGNATURE__________________________________  ________________________________________________________________________

## 2023-03-28 ENCOUNTER — Encounter (HOSPITAL_COMMUNITY): Payer: Self-pay

## 2023-03-28 ENCOUNTER — Other Ambulatory Visit: Payer: Self-pay

## 2023-03-28 ENCOUNTER — Encounter (HOSPITAL_COMMUNITY)
Admission: RE | Admit: 2023-03-28 | Discharge: 2023-03-28 | Disposition: A | Discharge status: Home / Self Care | Payer: 59 | Source: Ambulatory Visit | Attending: Surgery | Admitting: Surgery

## 2023-03-28 VITALS — BP 132/81 | HR 62 | Temp 98.4°F | Resp 14 | Ht 68.5 in | Wt 228.0 lb

## 2023-03-28 DIAGNOSIS — E119 Type 2 diabetes mellitus without complications: Secondary | ICD-10-CM | POA: Diagnosis not present

## 2023-03-28 DIAGNOSIS — Z01810 Encounter for preprocedural cardiovascular examination: Secondary | ICD-10-CM | POA: Diagnosis not present

## 2023-03-28 DIAGNOSIS — Z01812 Encounter for preprocedural laboratory examination: Secondary | ICD-10-CM | POA: Diagnosis not present

## 2023-03-28 DIAGNOSIS — Z794 Long term (current) use of insulin: Secondary | ICD-10-CM | POA: Diagnosis not present

## 2023-03-28 DIAGNOSIS — Z01818 Encounter for other preprocedural examination: Secondary | ICD-10-CM | POA: Diagnosis present

## 2023-03-28 HISTORY — DX: Gastro-esophageal reflux disease without esophagitis: K21.9

## 2023-03-28 LAB — CBC
HCT: 43.1 % (ref 39.0–52.0)
Hemoglobin: 14.2 g/dL (ref 13.0–17.0)
MCH: 27.8 pg (ref 26.0–34.0)
MCHC: 32.9 g/dL (ref 30.0–36.0)
MCV: 84.3 fL (ref 80.0–100.0)
Platelets: 318 10*3/uL (ref 150–400)
RBC: 5.11 MIL/uL (ref 4.22–5.81)
RDW: 13.9 % (ref 11.5–15.5)
WBC: 7 10*3/uL (ref 4.0–10.5)
nRBC: 0 % (ref 0.0–0.2)

## 2023-03-28 LAB — GLUCOSE, CAPILLARY: Glucose-Capillary: 91 mg/dL (ref 70–99)

## 2023-03-28 LAB — BASIC METABOLIC PANEL
Anion gap: 9 (ref 5–15)
BUN: 12 mg/dL (ref 6–20)
CO2: 24 mmol/L (ref 22–32)
Calcium: 9.1 mg/dL (ref 8.9–10.3)
Chloride: 105 mmol/L (ref 98–111)
Creatinine, Ser: 0.7 mg/dL (ref 0.61–1.24)
GFR, Estimated: >60 mL/min (ref >60)
Glucose, Bld: 90 mg/dL (ref 70–99)
Potassium: 3.8 mmol/L (ref 3.5–5.1)
Sodium: 138 mmol/L (ref 135–145)

## 2023-03-28 LAB — HEMOGLOBIN A1C
Hgb A1c MFr Bld: 4.7 % — ABNORMAL LOW (ref 4.8–5.6)
Mean Plasma Glucose: 88.19 mg/dL

## 2023-04-10 ENCOUNTER — Ambulatory Visit (HOSPITAL_COMMUNITY)
Admission: RE | Admit: 2023-04-10 | Discharge: 2023-04-10 | Disposition: A | Discharge status: Home / Self Care | Payer: 59 | Source: Ambulatory Visit | Attending: Surgery | Admitting: Surgery

## 2023-04-10 ENCOUNTER — Ambulatory Visit (HOSPITAL_BASED_OUTPATIENT_CLINIC_OR_DEPARTMENT_OTHER): Payer: 59 | Admitting: Anesthesiology

## 2023-04-10 ENCOUNTER — Ambulatory Visit (HOSPITAL_COMMUNITY): Payer: 59 | Admitting: Anesthesiology

## 2023-04-10 ENCOUNTER — Encounter (HOSPITAL_COMMUNITY): Payer: Self-pay | Admitting: Surgery

## 2023-04-10 ENCOUNTER — Encounter (HOSPITAL_COMMUNITY)
Admission: RE | Disposition: A | Discharge status: Home / Self Care | Payer: Self-pay | Source: Ambulatory Visit | Attending: Surgery

## 2023-04-10 ENCOUNTER — Other Ambulatory Visit: Payer: Self-pay

## 2023-04-10 DIAGNOSIS — E119 Type 2 diabetes mellitus without complications: Secondary | ICD-10-CM | POA: Insufficient documentation

## 2023-04-10 DIAGNOSIS — Z7985 Long-term (current) use of injectable non-insulin antidiabetic drugs: Secondary | ICD-10-CM | POA: Diagnosis not present

## 2023-04-10 DIAGNOSIS — K429 Umbilical hernia without obstruction or gangrene: Secondary | ICD-10-CM

## 2023-04-10 DIAGNOSIS — F17290 Nicotine dependence, other tobacco product, uncomplicated: Secondary | ICD-10-CM | POA: Insufficient documentation

## 2023-04-10 DIAGNOSIS — Z794 Long term (current) use of insulin: Secondary | ICD-10-CM

## 2023-04-10 LAB — GLUCOSE, CAPILLARY
Glucose-Capillary: 96 mg/dL (ref 70–99)
Glucose-Capillary: 95 mg/dL (ref 70–99)
Glucose-Capillary: 141 mg/dL — ABNORMAL HIGH (ref 70–99)

## 2023-04-10 SURGERY — REPAIR, HERNIA, UMBILICAL, ROBOT-ASSISTED
Anesthesia: General

## 2023-04-10 MED ORDER — MIDAZOLAM HCL 2 MG/2ML IJ SOLN
INTRAMUSCULAR | Status: AC
  Filled 2023-04-10: qty 2

## 2023-04-10 MED ORDER — CHLORHEXIDINE GLUCONATE 0.12 % MT SOLN
15.0000 mL | Freq: Once | OROMUCOSAL | Status: AC
  Administered 2023-04-10: 15 mL via OROMUCOSAL

## 2023-04-10 MED ORDER — ONDANSETRON HCL 4 MG/2ML IJ SOLN
INTRAMUSCULAR | Status: DC | PRN
  Administered 2023-04-10: 4 mg via INTRAVENOUS

## 2023-04-10 MED ORDER — KETAMINE HCL 50 MG/5ML IJ SOSY
PREFILLED_SYRINGE | INTRAMUSCULAR | Status: AC
  Filled 2023-04-10: qty 5

## 2023-04-10 MED ORDER — BUPIVACAINE LIPOSOME 1.3 % IJ SUSP: 20.0000 mL | Freq: Once | INTRAMUSCULAR | Status: DC

## 2023-04-10 MED ORDER — ROCURONIUM BROMIDE 10 MG/ML (PF) SYRINGE
PREFILLED_SYRINGE | INTRAVENOUS | Status: DC | PRN
  Administered 2023-04-10: 70 mg via INTRAVENOUS

## 2023-04-10 MED ORDER — LIDOCAINE HCL (PF) 2 % IJ SOLN
INTRAMUSCULAR | Status: AC
  Filled 2023-04-10: qty 5

## 2023-04-10 MED ORDER — MIDAZOLAM HCL 2 MG/2ML IJ SOLN
INTRAMUSCULAR | Status: DC | PRN
  Administered 2023-04-10: 2 mg via INTRAVENOUS

## 2023-04-10 MED ORDER — CHLORHEXIDINE GLUCONATE CLOTH 2 % EX PADS: 6.0000 | MEDICATED_PAD | Freq: Once | CUTANEOUS | Status: DC

## 2023-04-10 MED ORDER — FENTANYL CITRATE (PF) 250 MCG/5ML IJ SOLN
INTRAMUSCULAR | Status: AC
  Filled 2023-04-10: qty 5

## 2023-04-10 MED ORDER — CEFAZOLIN SODIUM-DEXTROSE 2-4 GM/100ML-% IV SOLN
2.0000 g | INTRAVENOUS | Status: AC
  Administered 2023-04-10: 2 g via INTRAVENOUS
  Filled 2023-04-10: qty 100

## 2023-04-10 MED ORDER — LIDOCAINE 2% (20 MG/ML) 5 ML SYRINGE
INTRAMUSCULAR | Status: DC | PRN
  Administered 2023-04-10: 100 mg via INTRAVENOUS

## 2023-04-10 MED ORDER — OXYCODONE-ACETAMINOPHEN 5-325 MG PO TABS: 1.0000 | ORAL_TABLET | ORAL | 0 refills | Status: AC | PRN

## 2023-04-10 MED ORDER — DEXAMETHASONE SODIUM PHOSPHATE 10 MG/ML IJ SOLN
INTRAMUSCULAR | Status: DC | PRN
  Administered 2023-04-10: 10 mg via INTRAVENOUS

## 2023-04-10 MED ORDER — ORAL CARE MOUTH RINSE: 15.0000 mL | Freq: Once | OROMUCOSAL | Status: AC

## 2023-04-10 MED ORDER — PROPOFOL 10 MG/ML IV BOLUS
INTRAVENOUS | Status: DC | PRN
  Administered 2023-04-10: 150 mg via INTRAVENOUS

## 2023-04-10 MED ORDER — GABAPENTIN 300 MG PO CAPS
300.0000 mg | ORAL_CAPSULE | ORAL | Status: AC
  Administered 2023-04-10: 300 mg via ORAL
  Filled 2023-04-10: qty 1

## 2023-04-10 MED ORDER — ONDANSETRON HCL 4 MG/2ML IJ SOLN
INTRAMUSCULAR | Status: AC
  Filled 2023-04-10: qty 2

## 2023-04-10 MED ORDER — 0.9 % SODIUM CHLORIDE (POUR BTL) OPTIME
TOPICAL | Status: DC | PRN
  Administered 2023-04-10: 1000 mL

## 2023-04-10 MED ORDER — ROCURONIUM BROMIDE 10 MG/ML (PF) SYRINGE
PREFILLED_SYRINGE | INTRAVENOUS | Status: AC
  Filled 2023-04-10: qty 10

## 2023-04-10 MED ORDER — LACTATED RINGERS IV SOLN: INTRAVENOUS | Status: DC

## 2023-04-10 MED ORDER — KETOROLAC TROMETHAMINE 15 MG/ML IJ SOLN
15.0000 mg | INTRAMUSCULAR | Status: AC
  Administered 2023-04-10: 15 mg via INTRAVENOUS
  Filled 2023-04-10: qty 1

## 2023-04-10 MED ORDER — BUPIVACAINE LIPOSOME 1.3 % IJ SUSP
INTRAMUSCULAR | Status: AC
  Filled 2023-04-10: qty 20

## 2023-04-10 MED ORDER — SUGAMMADEX SODIUM 200 MG/2ML IV SOLN
INTRAVENOUS | Status: DC | PRN
  Administered 2023-04-10: 200 mg via INTRAVENOUS

## 2023-04-10 MED ORDER — DEXAMETHASONE SODIUM PHOSPHATE 10 MG/ML IJ SOLN
INTRAMUSCULAR | Status: AC
  Filled 2023-04-10: qty 1

## 2023-04-10 MED ORDER — BUPIVACAINE LIPOSOME 1.3 % IJ SUSP
INTRAMUSCULAR | Status: DC | PRN
  Administered 2023-04-10: 20 mL

## 2023-04-10 MED ORDER — BUPIVACAINE-EPINEPHRINE 0.25% -1:200000 IJ SOLN
INTRAMUSCULAR | Status: DC | PRN
  Administered 2023-04-10: 30 mL

## 2023-04-10 MED ORDER — KETAMINE HCL 50 MG/5ML IJ SOSY
PREFILLED_SYRINGE | INTRAMUSCULAR | Status: DC | PRN
Start: 2023-04-10 — End: 2023-04-10
  Administered 2023-04-10 (×2): 10 mg via INTRAVENOUS
  Administered 2023-04-10: 30 mg via INTRAVENOUS

## 2023-04-10 MED ORDER — ACETAMINOPHEN 500 MG PO TABS
1000.0000 mg | ORAL_TABLET | ORAL | Status: AC
  Administered 2023-04-10: 1000 mg via ORAL
  Filled 2023-04-10: qty 2

## 2023-04-10 MED ORDER — BUPIVACAINE-EPINEPHRINE 0.25% -1:200000 IJ SOLN
INTRAMUSCULAR | Status: AC
  Filled 2023-04-10: qty 1

## 2023-04-10 MED ORDER — FENTANYL CITRATE PF 50 MCG/ML IJ SOSY: 25.0000 ug | PREFILLED_SYRINGE | INTRAMUSCULAR | Status: DC | PRN

## 2023-04-10 MED ORDER — FENTANYL CITRATE (PF) 250 MCG/5ML IJ SOLN
INTRAMUSCULAR | Status: DC | PRN
  Administered 2023-04-10: 50 ug via INTRAVENOUS
  Administered 2023-04-10: 100 ug via INTRAVENOUS
  Administered 2023-04-10 (×2): 50 ug via INTRAVENOUS

## 2023-04-10 SURGICAL SUPPLY — 62 items
ADH SKN CLS APL DERMABOND .7 (GAUZE/BANDAGES/DRESSINGS)
ANTIFOG SOL W/FOAM PAD STRL (MISCELLANEOUS) ×1
APL PRP STRL LF DISP 70% ISPRP (MISCELLANEOUS) ×1
BAG COUNTER SPONGE SURGICOUNT (BAG) ×2 IMPLANT
BAG SPNG CNTER NS LX DISP (BAG) ×1
BLADE SURG SZ11 CARB STEEL (BLADE) ×2 IMPLANT
CHLORAPREP W/TINT 26 (MISCELLANEOUS) ×2 IMPLANT
COVER MAYO STAND STRL (DRAPES) ×2 IMPLANT
COVER TIP SHEARS 8 DVNC (MISCELLANEOUS) ×2 IMPLANT
DERMABOND ADVANCED .7 DNX12 (GAUZE/BANDAGES/DRESSINGS) IMPLANT
DEVICE TROCAR PUNCTURE CLOSURE (ENDOMECHANICALS) IMPLANT
DRAPE ARM DVNC X/XI (DISPOSABLE) ×6 IMPLANT
DRAPE COLUMN DVNC XI (DISPOSABLE) ×2 IMPLANT
DRIVER NDL LRG 8 DVNC XI (INSTRUMENTS) ×4 IMPLANT
DRIVER NDL MEGA SUTCUT DVNCXI (INSTRUMENTS) ×4 IMPLANT
DRIVER NDLE LRG 8 DVNC XI (INSTRUMENTS) ×2
DRIVER NDLE MEGA SUTCUT DVNCXI (INSTRUMENTS) ×2
ELECT L-HOOK LAP 45CM DISP (ELECTROSURGICAL) ×1
ELECT PENCIL ROCKER SW 15FT (MISCELLANEOUS) ×2 IMPLANT
ELECT REM PT RETURN 15FT ADLT (MISCELLANEOUS) ×2 IMPLANT
ELECTRODE L-HOOK LAP 45CM DISP (ELECTROSURGICAL) ×2 IMPLANT
FORCEPS PROGRASP DVNC XI (FORCEP) ×2 IMPLANT
GLOVE BIO SURGEON STRL SZ7.5 (GLOVE) ×4 IMPLANT
GLOVE INDICATOR 8.0 STRL GRN (GLOVE) ×4 IMPLANT
GOWN STRL REUS W/ TWL XL LVL3 (GOWN DISPOSABLE) ×6 IMPLANT
GOWN STRL REUS W/TWL XL LVL3 (GOWN DISPOSABLE) ×3
GRASPER SUT TROCAR 14GX15 (MISCELLANEOUS) IMPLANT
GRASPER TIP-UP FEN DVNC XI (INSTRUMENTS) ×2 IMPLANT
IRRIG SUCT STRYKERFLOW 2 WTIP (MISCELLANEOUS)
IRRIGATION SUCT STRKRFLW 2 WTP (MISCELLANEOUS) IMPLANT
KIT BASIN OR (CUSTOM PROCEDURE TRAY) ×2 IMPLANT
KIT TURNOVER KIT A (KITS) IMPLANT
MANIFOLD NEPTUNE II (INSTRUMENTS) ×2 IMPLANT
MESH SOFT 12X12IN BARD (Mesh General) IMPLANT
NDL SPNL 18GX3.5 QUINCKE PK (NEEDLE) ×2 IMPLANT
NEEDLE SPNL 18GX3.5 QUINCKE PK (NEEDLE) ×1
PACK CARDIOVASCULAR III (CUSTOM PROCEDURE TRAY) ×2 IMPLANT
SCISSORS MNPLR CVD DVNC XI (INSTRUMENTS) ×2 IMPLANT
SEAL UNIV 5-12 XI (MISCELLANEOUS) ×6 IMPLANT
SET TUBE SMOKE EVAC HIGH FLOW (TUBING) ×2 IMPLANT
SOL ELECTROSURG ANTI STICK (MISCELLANEOUS) ×1
SOLUTION ANTFG W/FOAM PAD STRL (MISCELLANEOUS) ×2 IMPLANT
SOLUTION ELECTROSURG ANTI STCK (MISCELLANEOUS) ×2 IMPLANT
SPIKE FLUID TRANSFER (MISCELLANEOUS) ×2 IMPLANT
SUT MNCRL AB 4-0 PS2 18 (SUTURE) ×2 IMPLANT
SUT STRAFIX PDS 18 CTX (SUTURE) IMPLANT
SUT STRAFIX SYMMETRIC 0-0 24 (SUTURE)
SUT STRAFIX SYMMETRIC 1-0 12 (SUTURE)
SUT STRAFIX SYMMETRIC 1-0 24 (SUTURE)
SUT STRTFX SPIRAL PDS+ 2-0 23 (SUTURE) ×1
SUT VLOC 180 0 6IN GS21 (SUTURE) IMPLANT
SUT VLOC 180 0 9IN GS21 (SUTURE) IMPLANT
SUTURE STRAFIX SYMMETRC 0-0 24 (SUTURE) IMPLANT
SUTURE STRAFIX SYMMETRC 1-0 12 (SUTURE) IMPLANT
SUTURE STRAFIX SYMMETRC 1-0 24 (SUTURE) IMPLANT
SUTURE STRTFX SPRL PDS+ 2-0 23 (SUTURE) IMPLANT
SYR 20ML LL LF (SYRINGE) ×2 IMPLANT
TAPE STRIPS DRAPE STRL (GAUZE/BANDAGES/DRESSINGS) ×2 IMPLANT
TOWEL OR 17X26 10 PK STRL BLUE (TOWEL DISPOSABLE) ×2 IMPLANT
TOWEL OR NON WOVEN STRL DISP B (DISPOSABLE) IMPLANT
TROCAR ADV FIXATION 12X100MM (TROCAR) ×2 IMPLANT
TROCAR Z-THREAD FIOS 5X100MM (TROCAR) ×2 IMPLANT

## 2023-04-10 NOTE — Anesthesia Preprocedure Evaluation (Addendum)
Anesthesia Evaluation  Patient identified by MRN, date of birth, ID band Patient awake    Reviewed: Allergy & Precautions, NPO status , Patient's Chart, lab work & pertinent test results  Airway Mallampati: I  TM Distance: >3 FB Neck ROM: Full    Dental no notable dental hx. (+) Teeth Intact, Dental Advisory Given   Pulmonary Current Smoker and Patient abstained from smoking.   Pulmonary exam normal breath sounds clear to auscultation       Cardiovascular negative cardio ROS Normal cardiovascular exam Rhythm:Regular Rate:Normal     Neuro/Psych negative neurological ROS  negative psych ROS   GI/Hepatic Neg liver ROS,GERD  Controlled,,  Endo/Other  diabetes, Type 2    Renal/GU negative Renal ROS  negative genitourinary   Musculoskeletal negative musculoskeletal ROS (+)    Abdominal   Peds  Hematology negative hematology ROS (+)   Anesthesia Other Findings   Reproductive/Obstetrics                             Anesthesia Physical Anesthesia Plan  ASA: 2  Anesthesia Plan: General   Post-op Pain Management: Tylenol PO (pre-op)*, Ketamine IV* and Lidocaine infusion*   Induction: Intravenous  PONV Risk Score and Plan: 1 and Midazolam, Dexamethasone and Ondansetron  Airway Management Planned: Oral ETT  Additional Equipment:   Intra-op Plan:   Post-operative Plan: Extubation in OR  Informed Consent: I have reviewed the patients History and Physical, chart, labs and discussed the procedure including the risks, benefits and alternatives for the proposed anesthesia with the patient or authorized representative who has indicated his/her understanding and acceptance.     Dental advisory given  Plan Discussed with: CRNA  Anesthesia Plan Comments: (2 IVs)       Anesthesia Quick Evaluation

## 2023-04-10 NOTE — Transfer of Care (Signed)
Immediate Anesthesia Transfer of Care Note  Patient: Timothy Sutton  Procedure(s) Performed: XI ROBOT ASSISTED recurrent UMBILICAL HERNIA REPAIR WITH MESH  Patient Location: PACU  Anesthesia Type:General  Level of Consciousness: awake, alert , and oriented  Airway & Oxygen Therapy: Patient Spontanous Breathing and Patient connected to face mask oxygen  Post-op Assessment: Report given to RN and Post -op Vital signs reviewed and stable  Post vital signs: Reviewed and stable  Last Vitals:  Vitals Value Taken Time  BP 127/74   Temp    Pulse 86 04/10/23 1348  Resp 14   SpO2 99 % 04/10/23 1348  Vitals shown include unfiled device data.  Last Pain:  Vitals:   04/10/23 0856  TempSrc: Oral  PainSc:          Complications: No notable events documented.

## 2023-04-10 NOTE — Anesthesia Procedure Notes (Signed)
Procedure Name: Intubation Date/Time: 04/10/2023 11:56 AM  Performed by: Pearson Grippe, CRNAPre-anesthesia Checklist: Patient identified, Emergency Drugs available, Suction available and Patient being monitored Patient Re-evaluated:Patient Re-evaluated prior to induction Oxygen Delivery Method: Circle system utilized Preoxygenation: Pre-oxygenation with 100% oxygen Induction Type: IV induction Ventilation: Mask ventilation without difficulty Laryngoscope Size: Miller and 2 Grade View: Grade I Tube type: Oral Tube size: 7.5 mm Number of attempts: 1 Airway Equipment and Method: Stylet Placement Confirmation: ETT inserted through vocal cords under direct vision, positive ETCO2 and breath sounds checked- equal and bilateral Secured at: 24 cm Tube secured with: Tape Dental Injury: Teeth and Oropharynx as per pre-operative assessment

## 2023-04-10 NOTE — Discharge Instructions (Signed)
 VENTRAL HERNIA REPAIR POST OPERATIVE INSTRUCTIONS  Thinking Clearly  The anesthesia may cause you to feel different for 1 or 2 days. Do not drive, drink alcohol, or make any big decisions for at least 2 days.  Nutrition When you wake up, you will be able to drink small amounts of liquid. If you do not feel sick, you can slowly advance your diet to regular foods. Continue to drink lots of fluids, usually about 8 to 10 glasses per day. Eat a high-fiber diet so you don't strain during bowel movements. High-Fiber Foods Foods high in fiber include beans, bran cereals and whole-grain breads, peas, dried fruit (figs, apricots, and dates), raspberries, blackberries, strawberries, sweet corn, broccoli, baked potatoes with skin, plums, pears, apples, greens, and nuts. Activity Slowly increase your activity. Be sure to get up and walk every hour or so to prevent blood clots. No heavy lifting or strenuous activity for 4 weeks following surgery to prevent hernias at your incision sites or recurrence of your hernia. It is normal to feel tired. You may need more sleep than usual.  Get your rest but make sure to get up and move around frequently to prevent blood clots and pneumonia.  Work and Return to School You can go back to work when you feel well enough. Discuss the timing with your surgeon. You can usually go back to school or work 1 week or less after an laparoscopic or an open repair. If your work requires heavy lifting or strenuous activity you need to be placed on light duty for 4 weeks following surgery. You can return to gym class, sports or other physical activities 4 weeks after surgery.  Wound Care You may experience significant bruising throughout the abdominal wall that may track down into the groin including into the scrotum in males.  Rest, elevating the groin and scrotum above the level of the heart, ice and compression with tight fitting underwear or an abdominal binder can help.   Always wash your hands before and after touching near your incision site. Do not soak in a bathtub until cleared at your follow up appointment. You may take a shower 24 hours after surgery. A small amount of drainage from the incision is normal. If the drainage is thick and yellow or the site is red, you may have an infection, so call your surgeon. If you have a drain in one of your incisions, it will be taken out in office when the drainage stops. Steri-Strips will fall off in 7 to 10 days or they will be removed during your first office visit. If you have dermabond glue covering over the incision, allow the glue to flake off on its own. Protect the new skin, especially from the sun. The sun can burn and cause darker scarring. Your scar will heal in about 4 to 6 weeks and will become softer and continue to fade over the next year.  The cosmetic appearance of the incisions will improve over the course of the first year after surgery. Sensation around your incision will return in a few weeks or months.  Bowel Movements After intestinal surgery, you may have loose watery stools for several days. If watery diarrhea lasts longer than 3 days, contact your surgeon. Pain medication (narcotics) can cause constipation. Increase the fiber in your diet with high-fiber foods if you are constipated. You can take an over the counter stool softener like Colace to avoid constipation.  Additional over the counter medications can also be used   if Colace isn't sufficient (for example, Milk of Magnesia or Miralax).  Pain The amount of pain is different for each person. Some people need only 1 to 3 doses of pain control medication, while others need more. Take alternating doses of tylenol and ibuprofen around the clock for the first five days following surgery.  This will provide a baseline of pain control and help with inflammation.  Take the narcotic pain medication in addition if needed for severe pain.  Contact  Your Surgeon at [phone], if you have: Pain that will not go away Pain that gets worse A fever of more than 101F (38.3C) Repeated vomiting Swelling, redness, bleeding, or bad-smelling drainage from your wound site Strong abdominal pain No bowel movement or unable to pass gas for 3 days Watery diarrhea lasting longer than 3 days  Pain Control The goal of pain control is to minimize pain, keep you moving and help you heal. Your surgical team will work with you on your pain plan. Most often a combination of therapies and medications are used to control your pain. You may also be given medication (local anesthetic) at the surgical site. This may help control your pain for several days. Extreme pain puts extra stress on your body at a time when your body needs to focus on healing. Do not wait until your pain has reached a level "10" or is unbearable before telling your doctor or nurse. It is much easier to control pain before it becomes severe. Following a laparoscopic procedure, pain is sometimes felt in the shoulder. This is due to the gas inserted into your abdomen during the procedure. Moving and walking helps to decrease the gas and the right shoulder pain.  Use the guide below for ways to manage your post-operative pain. Learn more by going to facs.org/safepaincontrol.  How Intense Is My Pain Common Therapies to Feel Better       I hardly notice my pain, and it does not interfere with my activities.  I notice my pain and it distracts me, but I can still do activities (sitting up, walking, standing).  Non-Medication Therapies  Ice (in a bag, applied over clothing at the surgical site), elevation, rest, meditation, massage, distraction (music, TV, play) walking and mild exercise Splinting the abdomen with pillows +  Non-Opioid Medications Acetaminophen (Tylenol) Non-steroidal anti-inflammatory drugs (NSAIDS) Aspirin, Ibuprofen (Motrin, Advil) Naproxen (Aleve) Take these as  needed, when you feel pain. Both acetaminophen and NSAIDs help to decrease pain and swelling (inflammation).      My pain is hard to ignore and is more noticeable even when I rest.  My pain interferes with my usual activities.  Non-Medication Therapies  +  Non-Opioid medications  Take on a regular schedule (around-the-clock) instead of as needed. (For example, Tylenol every 6 hours at 9:00 am, 3:00 pm, 9:00 pm, 3:00 am and Motrin every 6 hours at 12:00 am, 6:00 am, 12:00 pm, 6:00 pm)         I am focused on my pain, and I am not doing my daily activities.  I am groaning in pain, and I cannot sleep. I am unable to do anything.  My pain is as bad as it could be, and nothing else matters.  Non-Medication Therapies  +  Around-the-Clock Non-Opioid Medications  +  Short-acting opioids  Opioids should be used with other medications to manage severe pain. Opioids block pain and give a feeling of euphoria (feel high). Addiction, a serious side effect of opioids, is   rare with short-term (a few days) use.  Examples of short-acting opioids include: Tramadol (Ultram), Hydrocodone (Norco, Vicodin), Hydromorphone (Dilaudid), Oxycodone (Oxycontin)     The above directions have been adapted from the [employer] Surgical Patient Education Program.  Please refer to the ACS website if needed: https://www.facs.org/-/media/files/education/patient-ed/[email or web address]_hernia.ashx   Paul Stechschulte, MD [facility] [address], [address], [place], [place]  [address] ?  [address] [address], [place], [place]   [address] [phone] ? [phone] ? FAX [phone] Web site: [email or web address]  

## 2023-04-10 NOTE — Op Note (Signed)
Patient: Timothy Sutton (05-28-1976, [id number])  Date of Surgery: 04/10/23  Preoperative Diagnosis: RECURRENT UMBILICAL HERNIA   Postoperative Diagnosis: RECURRENT UMBILICAL HERNIA   Surgical Procedure:  XI ROBOT ASSISTED recurrent UMBILICAL HERNIA REPAIR WITH MESH:   Bilateral posterior rectus myofascial release  Operative Team Members:  Surgeons and Role:    * Romin Divita, Hyman Hopes, MD - Primary   Anesthesiologist: Elmer Picker, MD CRNA: Doran Clay, CRNA; Pearson Grippe, CRNA   Anesthesia: General   Fluids:  Total I/O In: 900 [I.V.:800; IV Piggyback:100] Out: 20 [Blood:20]  Complications: None  Drains:  None  Specimen: * No specimens in log *   Disposition:  PACU - hemodynamically stable.  Plan of Care: Discharge to home after PACU  Indications for Procedure: Timothy Sutton is a 47 y.o. male who presented with a recurrent umbilical hernia, he believed a previous mesh repair.   I recommended robotic ventral hernia repair with mesh.  The procedure itself as well as the risks, benefits and alternatives were described.  The risks discussed included but were not limited to the risk of infection, bleeding, damage to nearby structures, recurrent hernia, chronic pain, and mesh complication requiring removal.  After a full discussion and all questions answered, the patient granted consent to proceed.  Findings:  Hernia Location: Ventral hernia location: Umbilical (M3) Hernia Size:  2 x 2 cm  Mesh Size &Type:  21 cm tall x 18 cm wide Bard Soft Mesh Mesh Position: Sublay - Retromuscular Myofascial Releases: Bilateral posterior rectus myofascial release   Description of Procedure: The patient was positioned supine, moderately flexed at the umbilical level, padded and secured on the operating table.  A timeout procedure was performed.    What is described is a robotic, totally extraperitoneal retromuscular incisional hernia repair with bilateral rectus  myofascial release and retromuscular mesh placement.  Laparoscopic Portion: The retrorectus space was entered in the LEFT hypochondrium, at approximately the midclavicular line utilizing a 5 mm optical-viewing trocar.  Upon safe entry into this space, it was insufflated while performing a blunt dissection with the camera still in the optical trocar. A rectus myofascial release was performed on the LEFT side. Dissection was carried out laterally in the retromuscular plane to the edge of the rectus sheath progressively disconnecting the rectus muscle from the underlying posterior rectus sheath. Both the segmental innervation as well as the intercostal artery and vein brances to the rectus muscle were individually preserved.    During the left sided retrorectus dissection, a 12 mm trocar was placed into the lateral most edge of the retrorectus space.  With these initial trocars in position, the medial most aspect of the retrorectus plane was identified, and the posterior sheath was visualized as it inserted on the linea alba. The posterior sheath was incised with cautery entering the preperitoneal plane. A crossover was performed dissecting under the linea alba in the preperitoneal plane until the right rectus sheath was identified.  After identification of the right rectus sheath, it was incised vertically to enter the retrorectus space on the right. A rectus myofascial release was performed on the RIGHT side.  Blunt dissection was carried out laterally in the retromuscular plane to the edge of the rectus sheath progressively disconnecting the rectus muscle from the underlying posterior rectus sheath. Both the segmental innervation as well as the intercostal artery and vein brances to the rectus muscle were individually preserved.   At this juncture, both retrorectus planes were initially connected to each  other and there was space for further trocar placement. An 8 mm robotic trocar was placed in the  midclavicular line in right retrorectus space.  A 8mm robotic trocar was placed within the left rectus musculature in the upper abdomen, and not through the linea alba.  The initial 5 mm access trocar in the midclavicular line within the left retrorectus space was switched out for an 8 mm robotic trocar.   Robotic Portion: The Intuitive daVinci Xi surgical robot was docked in the standard fashion and the procedure begun from the robotic console. A Prograsp instrument and monopolar shears were used for the dissection.  Dissection was carried down inferiorly preserving the peritoneum and the preperitoneal fat in the midline as it was gently dissected off of the overlying linea alba.  On the right side, the posterior rectus sheath was progressively disconnected from its insertion on the linea alba. This allowed for progression of the right side rectus myofascial release.  The rectus myofascial release accomplished medialization of the posterior rectus sheath towards the midline and disinsertion of the rectus muscle from its surrounding fascia, and thus its encasement in the rectus sheath, allowing for widening of the rectus muscle and transfer of the rectus flap towards the midline.  This will allow for future inset of the medial aspect of the flap for abdominal wall reconstruction.  Similarly, on the left side, the posterior rectus sheath was also progressively disconnected from its insertion on the linea alba.  This allowed for progression of the left side rectus myofascial release.  The rectus myofascial release accomplished medialization of the posterior rectus sheath towards the midline and disinsertion of the rectus muscle from its surrounding fascia, and thus its encasement in the rectus sheath, allowing for widening of the rectus muscle and transfer of the rectus flap towards the midline.  This will allow for future inset of the medial aspect of the flap for abdominal wall reconstruction.  During the  dissection of the midline the hernia defect was identified and the hernia sac was not reducible, therefore the hernia peritoneum was incised circumferentially around the edge of the hernia defect which left a defect within the peritoneum in the midline.  This defect was later closed with a running 2-0 Ethicon Stratafix Spiral PDS suture.  Both the left and the right rectus myofascial releases were performed towards the lower abdomen, past the arcuate line bilaterally.  During this dissection, the peritoneum and preperitoneal fat in the midline were further preserved below the hernia as they were dissected off of the overlying linea alba.   The hernia defect area was now visualized fully.  The hernia defects were located in the umbilical regions. Utilizing a metric ruler, the defect are was measured intracorporeally to be 2 cm horizontal by 2 cm vertical.  The hernia defect was closed utilizing a continuous, #1 Ethicon Stratafix Symmetric PDS Plus suture.  The hernia defect, and subsequently the rectus musculature, came together well for a complete abdominal wall reconstruction.  The dissected out retrorectus space was measured with a metric ruler so as to determine the size of the proposed mesh.    The robot was undocked and the laparoscope was inserted, inspecting for hemostasis.  The mesh deployment was performed laparoscopically.  Laparoscopic Portion:  A transversus abdominis plane (TAP) block was performed bilaterally with a mixture of marcaine and Exparel.  The anesthetic was first injected into the plane between the transversus abdominis and internal abdominal oblique muscles on the left. The TAP was  repeated on the contralateral side.   A piece of Bard Soft was opened and trimmed to 21 cm tall x 18 cm wide. The mesh was advanced into the retrorectus space and the mesh positioned flat against the intact posterior rectus sheaths. The mesh was not fixated as it occupied the entire retromuscular  plane, and also covered all of the trocars.  The trocars were removed and the skin closed with 4-0 Monocryl subcuticular sutures and skin glue.   Ivar Drape, MD General, Bariatric, & Minimally Invasive Surgery [facility], [place]

## 2023-04-10 NOTE — Anesthesia Postprocedure Evaluation (Signed)
Anesthesia Post Note  Patient: Timothy Sutton  Procedure(s) Performed: XI ROBOT ASSISTED recurrent UMBILICAL HERNIA REPAIR WITH MESH     Patient location during evaluation: PACU Anesthesia Type: General Level of consciousness: awake and alert Pain management: pain level controlled Vital Signs Assessment: post-procedure vital signs reviewed and stable Respiratory status: spontaneous breathing, nonlabored ventilation, respiratory function stable and patient connected to nasal cannula oxygen Cardiovascular status: blood pressure returned to baseline and stable Postop Assessment: no apparent nausea or vomiting Anesthetic complications: no  No notable events documented.  Last Vitals:  Vitals:   04/10/23 1430 04/10/23 1448  BP: 127/78 137/82  Pulse: 69 66  Resp: 13 14  Temp:  36.6 C  SpO2: 100% 100%    Last Pain:  Vitals:   04/10/23 1448  TempSrc: Oral  PainSc: 3                  Finnlee Silvernail L Manraj Yeo

## 2023-04-10 NOTE — H&P (Signed)
Admitting Physician: Hyman Hopes Treon Kehl  Service: General Surgery  CC: Hernia  Subjective   HPI: Timothy Sutton is an 47 y.o. male who is here for recurrent umbilical hernia  Past Medical History:  Diagnosis Date   Diabetes mellitus without complication (HCC)    GERD (gastroesophageal reflux disease)     Past Surgical History:  Procedure Laterality Date   HERNIA REPAIR     WISDOM TOOTH EXTRACTION      Family History  Problem Relation Age of Onset   Leukemia Father    Diabetes Father    Diabetes Other     Social:  reports that he has been smoking e-cigarettes. He does not have any smokeless tobacco history on file. He reports current alcohol use. He reports that he does not use drugs.  Allergies:  Allergies  Allergen Reactions   Tomato     reflux    Medications: Current Outpatient Medications  Medication Instructions   ezetimibe (ZETIA) 10 mg, Oral, Daily   ibuprofen (ADVIL) 400 mg, Oral, Every 12 hours PRN   Multiple Vitamin (MULTIVITAMIN PO) 1 tablet, Oral, Daily   omega-3 acid ethyl esters (LOVAZA) 2 g, Oral, Daily   OZEMPIC, 2 MG/DOSE, 8 MG/3ML SOPN INJECT 2MG  SUBCUTANEOUSLY ONCE A WEEK   Semaglutide-Weight Management 1.7 MG/0.75ML SOAJ Inject 1.7 mg into the skin once weekly.    ROS - all of the below systems have been reviewed with the patient and positives are indicated with bold text General: chills, fever or night sweats Eyes: blurry vision or double vision ENT: epistaxis or sore throat Allergy/Immunology: itchy/watery eyes or nasal congestion Hematologic/Lymphatic: bleeding problems, blood clots or swollen lymph nodes Endocrine: temperature intolerance or unexpected weight changes Breast: new or changing breast lumps or nipple discharge Resp: cough, shortness of breath, or wheezing CV: chest pain or dyspnea on exertion GI: as per HPI GU: dysuria, trouble voiding, or hematuria MSK: joint pain or joint stiffness Neuro: TIA or stroke  symptoms Derm: pruritus and skin lesion changes Psych: anxiety and depression  Objective   PE Blood pressure 121/79, pulse (!) 58, temperature 98.2 F (36.8 C), temperature source Oral, resp. rate 18, height 5' 8.5" (1.74 m), weight 103.4 kg, SpO2 98%. Constitutional: NAD; conversant; no deformities Eyes: Moist conjunctiva; no lid lag; anicteric; PERRL Neck: Trachea midline; no thyromegaly Lungs: Normal respiratory effort; no tactile fremitus CV: RRR; no palpable thrills; no pitting edema GI: Abd umbilical hernia; no palpable hepatosplenomegaly MSK: Normal range of motion of extremities; no clubbing/cyanosis Psychiatric: Appropriate affect; alert and oriented x3 Lymphatic: No palpable cervical or axillary lymphadenopathy  Results for orders placed or performed during the hospital encounter of 04/10/23 (from the past 24 hour(s))  Glucose, capillary     Status: None   Collection Time: 04/10/23  8:52 AM  Result Value Ref Range   Glucose-Capillary 96 70 - 99 mg/dL    Imaging Orders  No imaging studies ordered today  None   Assessment and Plan   Recurrent umbilical hernia with incarceration   Mr. Petsch has an approximately 2 cm x 2 cm recurrent umbilical hernia with incarceration. I recommended robotic recurrent umbilical hernia repair with mesh. We discussed the procedure itself as well as its risk, benefits, and alternatives. After full discussion all questions answered the patient granted consent to proceed. Our surgery scheduler will reach out to the patient to schedule surgery.      Quentin Ore, MD  [facility] Use [email or web address] to contact on  call provider

## 2023-12-25 ENCOUNTER — Other Ambulatory Visit (HOSPITAL_COMMUNITY): Payer: Self-pay
